# Patient Record
Sex: Female | Born: 1965 | Race: Black or African American | Hispanic: No | Marital: Married | Smoking: Former smoker
Health system: Southern US, Community
[De-identification: ages and names within clinical notes are randomized; demographics above are authoritative.]

---

## 1998-08-19 ENCOUNTER — Other Ambulatory Visit: Admission: RE | Admit: 1998-08-19 | Discharge: 1998-08-19 | Payer: Self-pay | Admitting: Obstetrics and Gynecology

## 1999-09-11 ENCOUNTER — Other Ambulatory Visit: Admission: RE | Admit: 1999-09-11 | Discharge: 1999-09-11 | Payer: Self-pay | Admitting: *Deleted

## 2000-09-13 ENCOUNTER — Other Ambulatory Visit: Admission: RE | Admit: 2000-09-13 | Discharge: 2000-09-13 | Payer: Self-pay | Admitting: Obstetrics and Gynecology

## 2001-10-25 ENCOUNTER — Other Ambulatory Visit: Admission: RE | Admit: 2001-10-25 | Discharge: 2001-10-25 | Payer: Self-pay | Admitting: Obstetrics and Gynecology

## 2002-10-30 ENCOUNTER — Other Ambulatory Visit: Admission: RE | Admit: 2002-10-30 | Discharge: 2002-10-30 | Payer: Self-pay | Admitting: *Deleted

## 2003-07-26 ENCOUNTER — Ambulatory Visit (HOSPITAL_COMMUNITY): Admission: RE | Admit: 2003-07-26 | Discharge: 2003-07-26 | Payer: Self-pay | Admitting: Obstetrics and Gynecology

## 2003-08-23 ENCOUNTER — Ambulatory Visit (HOSPITAL_COMMUNITY): Admission: RE | Admit: 2003-08-23 | Discharge: 2003-08-23 | Payer: Self-pay | Admitting: Obstetrics and Gynecology

## 2003-12-23 ENCOUNTER — Inpatient Hospital Stay (HOSPITAL_COMMUNITY): Admission: AD | Admit: 2003-12-23 | Discharge: 2003-12-25 | Payer: Self-pay | Admitting: Obstetrics and Gynecology

## 2004-05-06 ENCOUNTER — Other Ambulatory Visit: Admission: RE | Admit: 2004-05-06 | Discharge: 2004-05-06 | Payer: Self-pay | Admitting: Obstetrics and Gynecology

## 2005-05-10 ENCOUNTER — Other Ambulatory Visit: Admission: RE | Admit: 2005-05-10 | Discharge: 2005-05-10 | Payer: Self-pay | Admitting: Obstetrics and Gynecology

## 2005-11-23 IMAGING — US US OB DETAIL+14 WK
1 series · 13 of 28 positions shown · non-contrast
Comparison: none

CLINICAL DATA: 37-year-old.  G1 P0 with LMP of 03/17/03.  AMA.  Scan for anatomy.

[Series 1: unknown · 0.30mm/px · 13 of 67 slices shown]
[im 3/67]
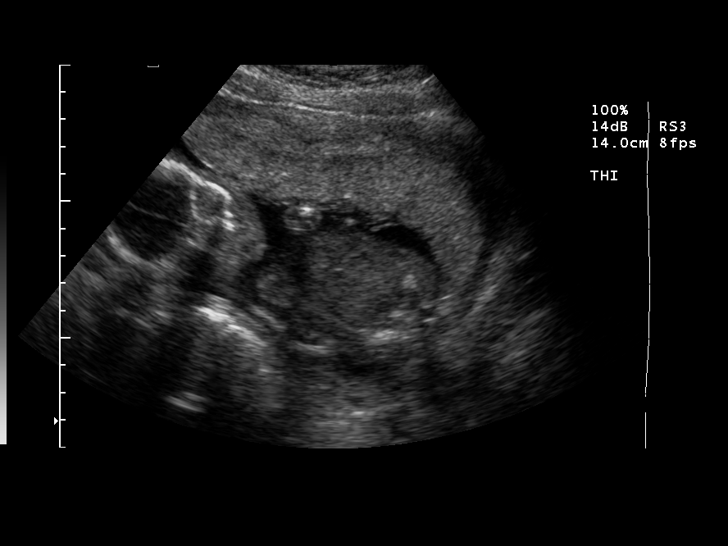
[im 8/67]
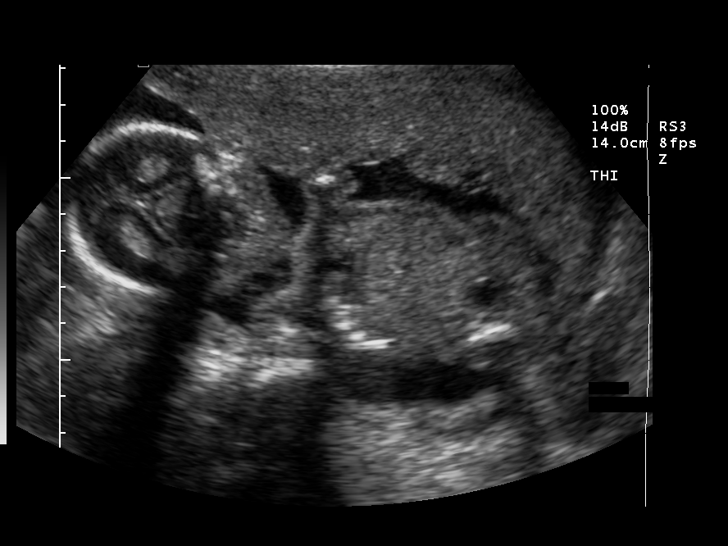
[im 13/67]
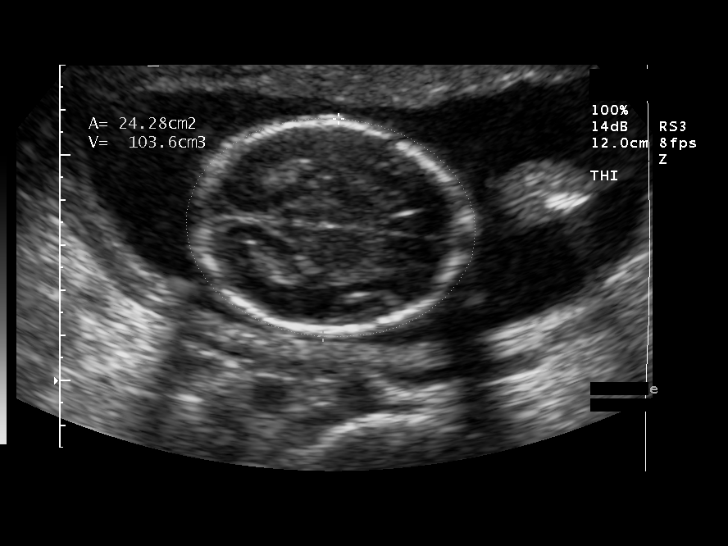
[im 18/67]
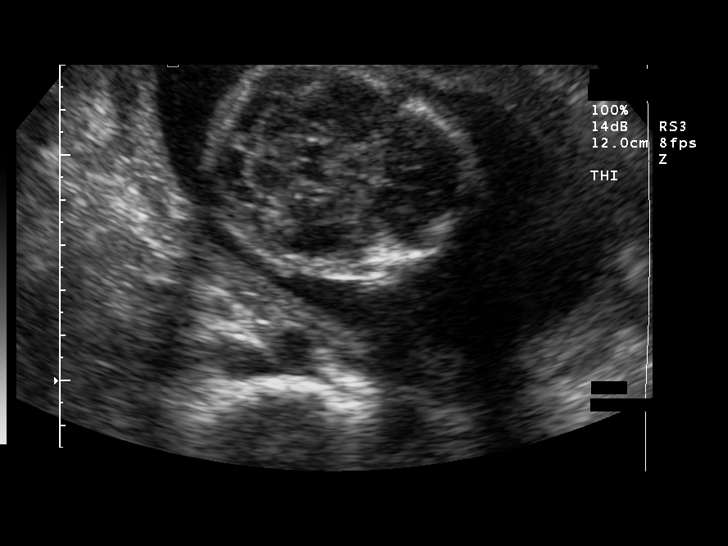
[im 23/67]
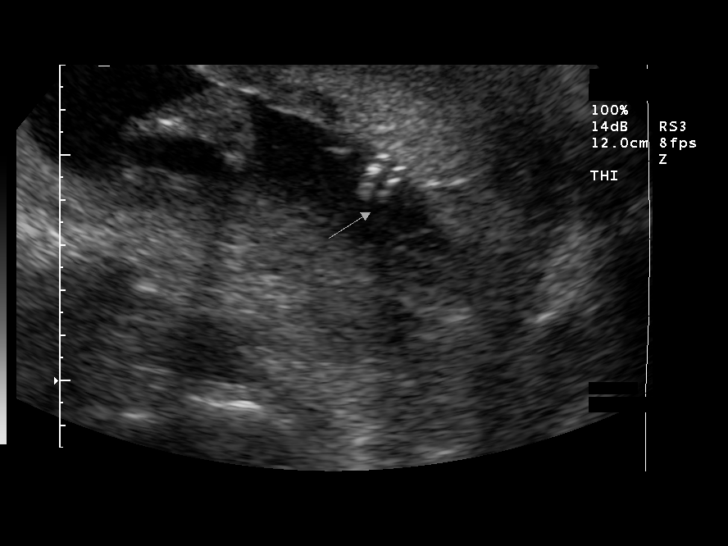
[im 27/67]
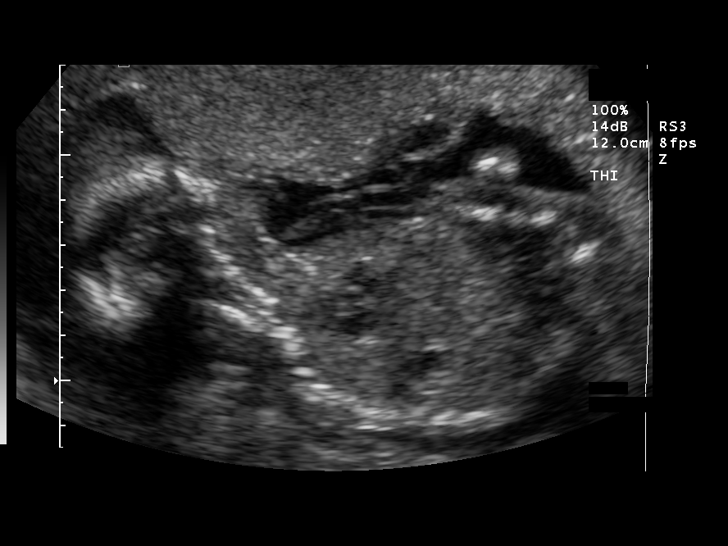
[im 35/67]
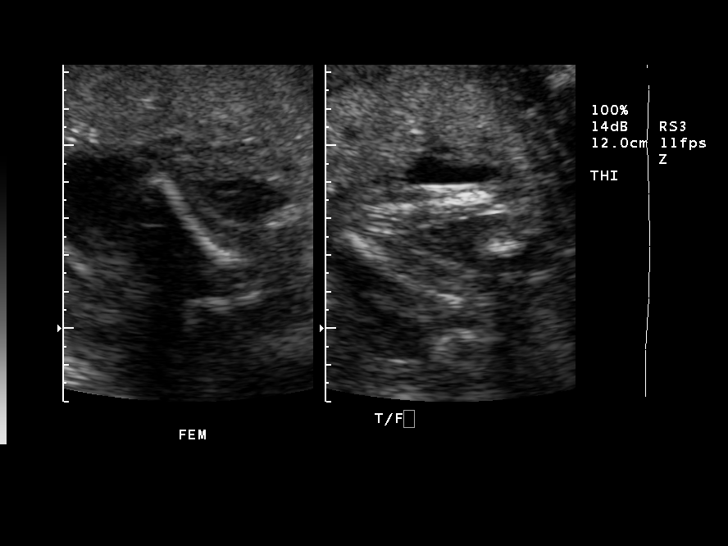
[im 40/67]
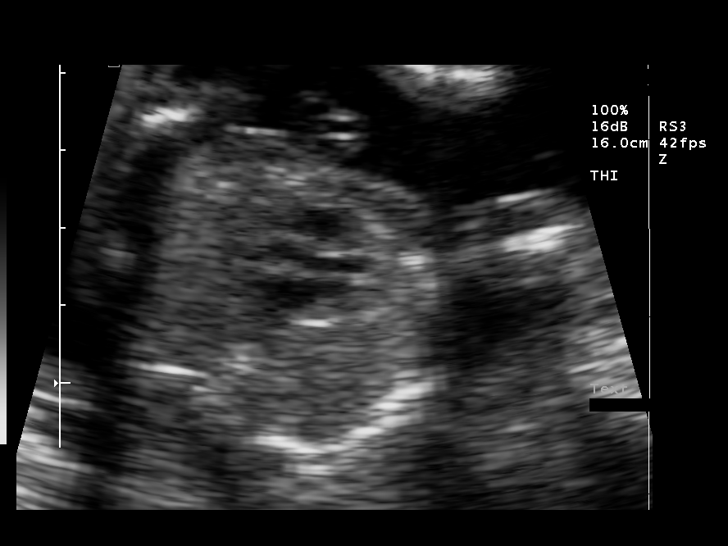
[im 45/67]
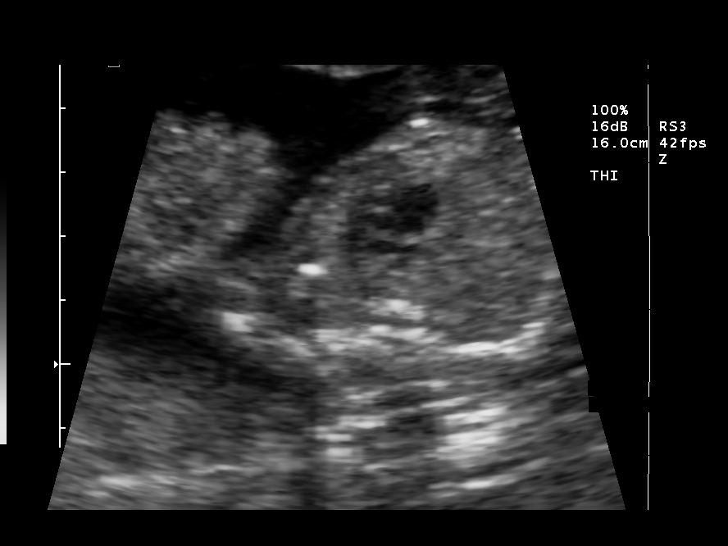
[im 49/67]
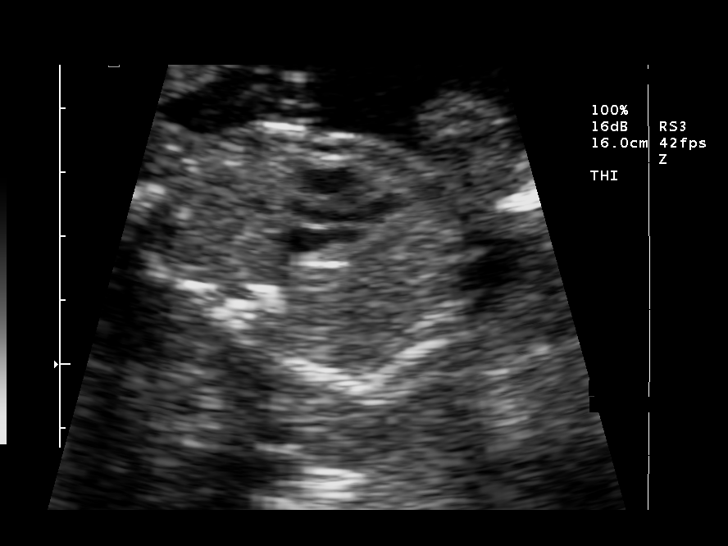
[im 54/67]
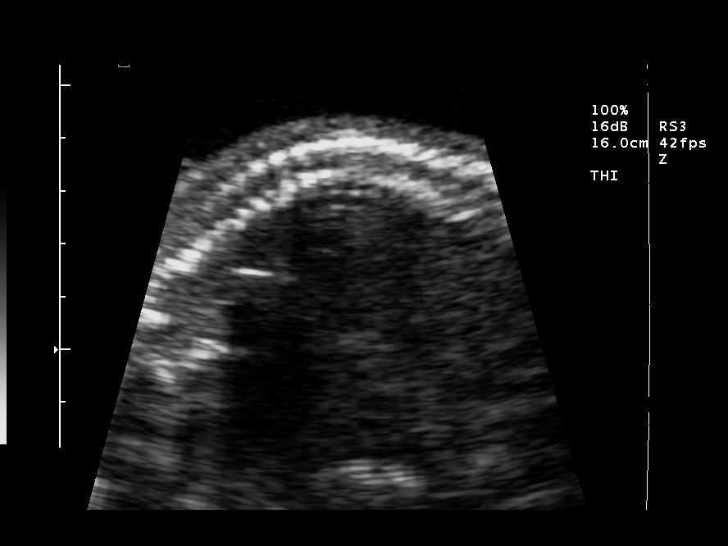
[im 59/67]
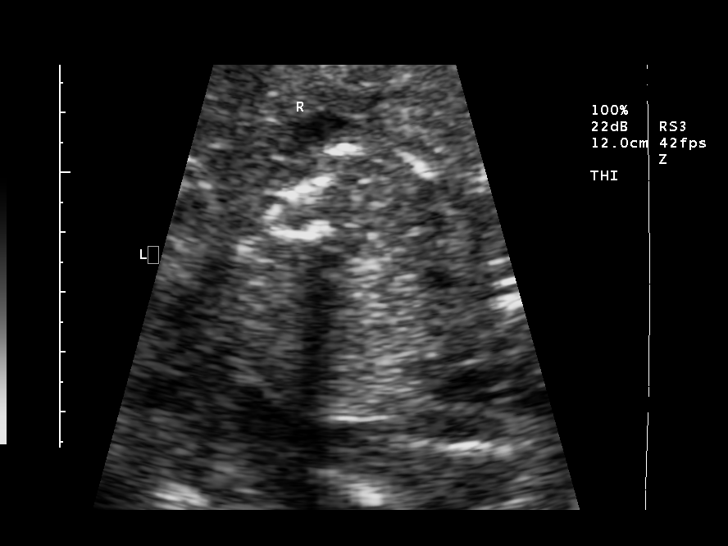
[im 64/67]
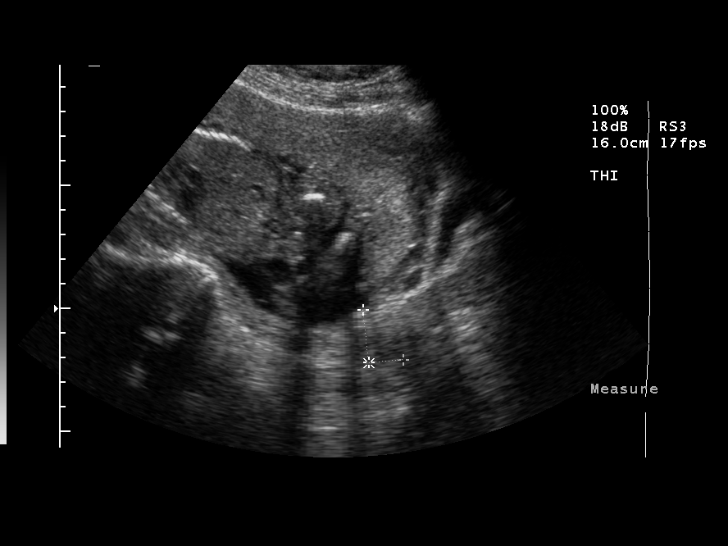

[13 of 28 positions shown; findings below may reference images not displayed]

DETAILED OBSTETRICAL ULTRASOUND 

 Number of Fetuses:  1
 Heart Rate:  140
 Movement:  Yes
 Breathing:  No
 Presentation:  Breech
 Placental Location:  Anterior, low-lying
 Grade:  I
 Amniotic Fluid (Subjective):  Normal
 Amniotic Fluid (Objective):  4.3 cm Vertical pocket 

 FETAL BIOMETRY
 BPD:  4.6 cm  19 w 6 d
 HC:  17.4 cm   20 w 0 d
 AC:  14.7 cm   20 w 0 d
 FL:   3.4 cm   20 w 6 d
 HL:   3.5 cm  21 w 6 d

 MEAN GA:  20 w 4 d

 FETAL ANATOMY
 Lateral Ventricles:  Visualized 
 Thalami/CSP:  Visualized 
 Posterior Fossa:  Visualized 
 Nuchal Region:  N/A
 Spine:  Limited
 4 Chamber Heart on Left:  Visualized 
 Stomach on Left:  Visualized 
 3 Vessel Cord:    Visualized 
 Cord Insertion Site:  Visualized 
 Kidneys:  Visualized 
 Bladder:  Visualized 
 Extremities:  Visualized 

 ADDITIONAL ANATOMY VISUALIZED:  LVOT, RVOT, upper lip, orbits, profile, diaphragm, heel, 5th digit, ductal arch, aortic arch, and female genitalia

 MATERNAL FINDINGS
 Cervix:  3.5 cm Transabdominally
IMPRESSION: Single living intrauterine fetus in breech presentation.  Note is made of a low-lying placenta.  No fetal anomalies are identified.  

 </u12:p>

## 2007-11-03 ENCOUNTER — Ambulatory Visit (HOSPITAL_COMMUNITY): Admission: RE | Admit: 2007-11-03 | Discharge: 2007-11-03 | Payer: Self-pay | Admitting: Obstetrics and Gynecology

## 2007-11-03 ENCOUNTER — Encounter (INDEPENDENT_AMBULATORY_CARE_PROVIDER_SITE_OTHER): Payer: Self-pay | Admitting: Obstetrics and Gynecology

## 2008-04-19 ENCOUNTER — Encounter: Admission: RE | Admit: 2008-04-19 | Discharge: 2008-04-19 | Payer: Self-pay | Admitting: Obstetrics and Gynecology

## 2009-04-21 ENCOUNTER — Encounter: Admission: RE | Admit: 2009-04-21 | Discharge: 2009-04-21 | Payer: Self-pay | Admitting: Obstetrics and Gynecology

## 2009-04-23 ENCOUNTER — Encounter: Admission: RE | Admit: 2009-04-23 | Discharge: 2009-04-23 | Payer: Self-pay | Admitting: Obstetrics and Gynecology

## 2009-08-30 ENCOUNTER — Inpatient Hospital Stay (HOSPITAL_COMMUNITY): Admission: AD | Admit: 2009-08-30 | Discharge: 2009-08-30 | Payer: Self-pay | Admitting: Obstetrics and Gynecology

## 2009-09-01 ENCOUNTER — Inpatient Hospital Stay (HOSPITAL_COMMUNITY): Admission: AD | Admit: 2009-09-01 | Discharge: 2009-09-01 | Payer: Self-pay | Admitting: Pediatrics

## 2010-04-19 ENCOUNTER — Encounter: Payer: Self-pay | Admitting: Obstetrics and Gynecology

## 2010-04-22 ENCOUNTER — Encounter
Admission: RE | Admit: 2010-04-22 | Discharge: 2010-04-22 | Payer: Self-pay | Source: Home / Self Care | Attending: Obstetrics and Gynecology | Admitting: Obstetrics and Gynecology

## 2010-06-15 LAB — CBC
Platelets: 188 10*3/uL (ref 150–400)
RBC: 3.96 MIL/uL (ref 3.87–5.11)
WBC: 7.9 10*3/uL (ref 4.0–10.5)

## 2010-06-15 LAB — HCG, QUANTITATIVE, PREGNANCY: hCG, Beta Chain, Quant, S: 17621 m[IU]/mL — ABNORMAL HIGH (ref <5)

## 2010-06-15 LAB — URINALYSIS, ROUTINE W REFLEX MICROSCOPIC
Bilirubin Urine: NEGATIVE
Ketones, ur: NEGATIVE mg/dL
Specific Gravity, Urine: 1.01 (ref 1.005–1.030)
pH: 7 (ref 5.0–8.0)

## 2010-06-15 LAB — URINE MICROSCOPIC-ADD ON

## 2010-08-11 NOTE — Op Note (Signed)
[name], Meagan Cohen              ACCOUNT NO.:  [id number]   MEDICAL RECORD NO.:  [id number]          PATIENT TYPE:  AMB   LOCATION:  SDC                           FACILITY:  [facility]   PHYSICIAN:  Osborn Coho, M.D.   DATE OF BIRTH:  Aug 12, 1965   DATE OF PROCEDURE:  11/03/2007  DATE OF DISCHARGE:                               OPERATIVE REPORT   PREOPERATIVE DIAGNOSIS:  Missed abortion.   POSTOPERATIVE DIAGNOSIS:  Missed abortion.   PROCEDURE:  Dilatation and evacuation.   ATTENDANT:  Osborn Coho, MD   ANESTHESIA:  MAC.   SPECIMEN TO PATHOLOGY:  POC.   FLUIDS:  700 mL.   URINE OUTPUT:  Quantity sufficient via straight cath prior to procedure.   ESTIMATED BLOOD LOSS:  Minimal.   COMPLICATIONS:  None.   PROCEDURE:  The patient was taken to the operating room after the risks,  benefits, and alternatives discussed with the patient.  The patient  verbalized understanding, consent signed and witnessed.  The patient was  placed under MAC per anesthesia, and prepped and draped in a normal  sterile fashion in the dorsal lithotomy position.  A bivalve speculum  was placed in the patient's vagina and the anterior lip of the cervix  was grasped with a single-tooth tenaculum.  A paracervical block was  administered using a total of 10 mL of 1% lidocaine.  The uterus sounded  to 9 cm and cervix was dilated for passage of the size 7 suction  curette.  Suction curettage was performed until minimal tissue returned.  Sharp curettage was performed until a gritty texture was noted.  Suction  curettage was performed once again to remove any remaining debris.  All  instruments were removed and there was good hemostasis at the tenaculum  site.  Count was correct.  The patient tolerated the procedure well and  is currently awaiting transfer to the recovery room in good condition.      Osborn Coho, M.D.  Electronically Signed     AR/MEDQ  D:  11/03/2007  T:  11/04/2007  Job:  [id number]

## 2010-08-14 NOTE — H&P (Signed)
Meagan Cohen              ACCOUNT NO.:  [id number]   MEDICAL RECORD NO.:  [id number]          PATIENT TYPE:  INP   LOCATION:  9170                          FACILITY:  [facility]   PHYSICIAN:  Janine Limbo, M.D.DATE OF BIRTH:  17-Jul-1965   DATE OF ADMISSION:  12/23/2003  DATE OF DISCHARGE:                                HISTORY & PHYSICAL   HISTORY OF PRESENT ILLNESS:  Ms. Meagan Cohen is a 45 year old female gravida 1  para 0 who presents at 41-and-a-half weeks gestation ([facility] December 12, 2003).  She presents for induction of labor.  She has been followed at the  [facility] division [facility].  The pregnancy has largely been uncomplicated.  The patient  declined amniocentesis.   DRUG ALLERGIES:  PENICILLIN causes a rash.   PAST MEDICAL HISTORY:  The patient had a dislocated elbow and broken thumb,  neither of which are of consequence at this point.  She has had surgery on  both of her feet.  She has had undiagnosed pedal edema.  She had her wisdom  teeth removed at age 69.   SOCIAL HISTORY:  The patient denies cigarette use, alcohol use, and  recreational drug use.   REVIEW OF SYSTEMS:  Normal pregnancy complaints.   FAMILY HISTORY:  Noncontributory.   PHYSICAL EXAMINATION:  VITAL SIGNS:  Weight is 186 pounds.  HEENT:  Within normal limits.  CHEST:  Clear.  HEART:  Regular rate and rhythm.  BREASTS:  Without masses.  ABDOMEN:  Gravid with a fundal height of 38 cm.  EXTREMITIES:  Within normal limits.  NEUROLOGIC:  Grossly normal.  PELVIC:  Cervix today is 2 cm dilated, 75% effaced, and -1 in station.   LABORATORY VALUES:  Blood type is A positive, antibody screen negative.  Sickle cell negative.  VDRL nonreactive.  Rubella immune.  HBsAg negative.  Third trimester beta strep negative.  Third trimester chlamydia negative.  Third trimester gonorrhea negative.   IMPRESSION:  1.  Forty-one-and-a-half weeks  gestation.  2.  Age greater than 35.   PLAN:  The patient is admitted for induction of labor.      AVS/MEDQ  D:  12/23/2003  T:  12/23/2003  Job:  [id number]

## 2010-12-25 LAB — CBC
HCT: 40.3
Hemoglobin: 13.6
MCV: 90.5
Platelets: 211
RBC: 4.45
WBC: 7.3

## 2011-04-01 ENCOUNTER — Other Ambulatory Visit: Payer: Self-pay | Admitting: Obstetrics and Gynecology

## 2011-04-01 DIAGNOSIS — Z1231 Encounter for screening mammogram for malignant neoplasm of breast: Secondary | ICD-10-CM

## 2011-04-27 ENCOUNTER — Ambulatory Visit
Admission: RE | Admit: 2011-04-27 | Discharge: 2011-04-27 | Disposition: A | Discharge status: Home / Self Care | Payer: BC Managed Care – PPO | Source: Ambulatory Visit | Attending: Obstetrics and Gynecology | Admitting: Obstetrics and Gynecology

## 2011-04-27 DIAGNOSIS — Z1231 Encounter for screening mammogram for malignant neoplasm of breast: Secondary | ICD-10-CM

## 2011-06-14 ENCOUNTER — Ambulatory Visit (INDEPENDENT_AMBULATORY_CARE_PROVIDER_SITE_OTHER): Payer: BC Managed Care – PPO | Admitting: Obstetrics and Gynecology

## 2011-06-14 DIAGNOSIS — Z01419 Encounter for gynecological examination (general) (routine) without abnormal findings: Secondary | ICD-10-CM

## 2011-11-30 ENCOUNTER — Ambulatory Visit: Payer: BC Managed Care – PPO

## 2011-11-30 ENCOUNTER — Ambulatory Visit (INDEPENDENT_AMBULATORY_CARE_PROVIDER_SITE_OTHER): Payer: BC Managed Care – PPO | Admitting: Family Medicine

## 2011-11-30 VITALS — BP 112/71 | HR 73 | Temp 99.4°F | Resp 18 | Ht 67.0 in | Wt 177.0 lb

## 2011-11-30 DIAGNOSIS — M25519 Pain in unspecified shoulder: Secondary | ICD-10-CM

## 2011-11-30 DIAGNOSIS — M25512 Pain in left shoulder: Secondary | ICD-10-CM

## 2011-11-30 MED ORDER — HYDROCODONE-ACETAMINOPHEN 5-325 MG PO TABS: 1.0000 | ORAL_TABLET | Freq: Four times a day (QID) | ORAL | Status: AC | PRN

## 2011-11-30 MED ORDER — CYCLOBENZAPRINE HCL 10 MG PO TABS: 10.0000 mg | ORAL_TABLET | Freq: Three times a day (TID) | ORAL | Status: AC | PRN

## 2011-11-30 NOTE — Patient Instructions (Signed)
Your xray looks good. I have sent in a muscle relaxer for you.  Use this at night. Use the Vicodin for pain relief. Heat and massage are also good for your back.    I'm sorry this happened.  It was good to meet you!
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
## 2011-12-13 ENCOUNTER — Ambulatory Visit (INDEPENDENT_AMBULATORY_CARE_PROVIDER_SITE_OTHER): Payer: BC Managed Care – PPO | Admitting: Family Medicine

## 2011-12-13 VITALS — BP 115/76 | HR 68 | Temp 97.8°F | Resp 16 | Ht 66.5 in | Wt 178.0 lb

## 2011-12-13 DIAGNOSIS — M542 Cervicalgia: Secondary | ICD-10-CM

## 2011-12-13 DIAGNOSIS — M7650 Patellar tendinitis, unspecified knee: Secondary | ICD-10-CM

## 2011-12-13 MED ORDER — HYDROCODONE-ACETAMINOPHEN 5-325 MG PO TABS: 1.0000 | ORAL_TABLET | Freq: Four times a day (QID) | ORAL | Status: DC | PRN

## 2011-12-13 NOTE — Patient Instructions (Addendum)
Patellar Tendinitis, Jumper's Knee with Rehab Tendinitis is inflammation of a tendon. Tendonitis of the tendon below the kneecap (patella) is known as patellar tendonitis. Patellar tendonitis is a common cause of pain below the kneecap (infrapatella). Patellar tendonitis may involve a tear (strain) in the ligament. Strains are classified into three categories. Grade 1 strains cause pain, but the tendon is not lengthened. Grade 2 strains include a lengthened ligament, due to the ligament being stretched or partially ruptured. With grade 2 strains there is still function, although function may be decreased. Grade 3 strains involve a complete tear of the tendon or muscle, and function is usually impaired. Patellar tendon strains are usually grade 1 or 2.  SYMPTOMS   Pain, tenderness, swelling, warmth, or redness over the patellar tendon (just below the kneecap).   Pain and loss of strength (sometimes), with forcefully straightening the knee (especially when jumping or rising from a seated or squatting position), or bending the knee completely (squatting or kneeling).   Crackling sound (crepitation) when the tendon is moved or touched.  CAUSES  Patellar tendonitis is caused by injury to the patellar tendon. The inflammation is the body's healing response. Common causes of injury include:  Stress from a sudden increase in intensity, frequency, or duration of training.   Overuse of the quadriceps thigh muscles and patellar tendon.   Direct hit (trauma) to the knee or patellar tendon.  RISK INCREASES WITH:  Sports that require sudden, explosive thigh muscle (quadriceps) contraction, such as jumping, quick starts, or kicking.   Running sports, especially running down hills.   Poor strength and flexibility of the thigh and knee.   Flat feet.  PREVENTION  Warm up and stretch properly before activity.   Allow for adequate recovery between workouts.   Maintain physical fitness:   Strength,  flexibility, and endurance.   Cardiovascular fitness.   Protect the knee joint with taping, protective strapping, bracing, or elastic compression bandage.   Wear arch supports (orthotics).  PROGNOSIS  If treated properly, patellar tendonitis usually heals within 6 weeks.  RELATED COMPLICATIONS   Longer healing time, if not properly treated or if not given enough time to heal.   Recurring symptoms, if activity is resumed too soon, with overuse, with a direct blow, or when using poor technique.   If untreated, tendon rupture requiring surgery.  TREATMENT Treatment first involves the use of ice and medicine, to reduce pain and inflammation. The use of strengthening and stretching exercises may help reduce pain with activity. These exercises may be performed at home or with a therapist. Serious cases of tendonitis may require restraining the knee for 10 to 14 days, to prevent stress on the tendon and to promote healing. Crutches may be used (uncommon) until you can walk without a limp. For cases in which non-surgical treatment is unsuccessful, surgery may be advised, to remove the inflamed tendon lining (sheath). Surgery is rare, and is only advised after at least 6 months of non-surgical treatment. MEDICATION   If pain medicine is needed, nonsteroidal anti-inflammatory medicines (aspirin and ibuprofen), or other minor pain relievers (acetaminophen), are often advised.   Do not take pain medicine for 7 days before surgery.   Prescription pain relievers may be given, if your caregiver thinks they are needed. Use only as directed and only as much as you need.  HEAT AND COLD  Cold treatment (icing) should be applied for 10 to 15 minutes every 2 to 3 hours for inflammation and pain, and immediately  after activity that aggravates your symptoms. Use ice packs or an ice massage.   Heat treatment may be used before performing stretching and strengthening activities prescribed by your caregiver,  physical therapist, or athletic trainer. Use a heat pack or a warm water soak.  SEEK MEDICAL CARE IF:  Symptoms get worse or do not improve in 2 weeks, despite treatment.   New, unexplained symptoms develop. (Drugs used in treatment may produce side effects.)  EXERCISES RANGE OF MOTION (ROM) AND STRETCHING EXERCISES - Patellar Tendinitis (Jumper's Knee) These are some of the initial exercises with which you may start your rehabilitation program, until you see your caregiver again or until your symptoms are resolved. Remember:   Flexible tissue is more tolerant of the stresses placed on it during activities.   Each stretch should be held for 20 to 30 seconds.   A gentle stretching sensation should be felt.  STRETCH - Hamstrings, Supine  Lie on your back. Loop a belt or towel over the ball of your right / left foot.   Straighten your right / left knee and slowly pull on the belt to raise your leg. Do not allow the right / left knee to bend. Keep your opposite leg flat on the floor.   Raise the leg until you feel a gentle stretch behind your right / left knee or thigh. Hold this position for __________ seconds.  Repeat __________ times. Complete this stretch __________ times per day.  STRETCH - Hamstrings, Doorway  Lie on your back with your right / left leg extended and resting on the wall, and the opposite leg flat on the ground through the door. At first, position your bottom farther away from the wall.   Keep your right / left knee straight. If you feel a stretch behind your knee or thigh, hold this position for __________ seconds.   If you do not feel a stretch, scoot your bottom closer to the door, and hold __________ seconds.  Repeat __________ times. Complete this stretch __________ times per day.  STRETCH - Hamstrings, Standing  Stand or sit and extend your right / left leg, placing your foot on a chair or foot stool.   Keep a slight arch in your low back and your hips  straight forward.   Lead with your chest and lean forward at the waist until you feel a gentle stretch in the back of your right / left knee or thigh. (When done correctly, this exercise requires leaning only a small distance.)   Hold this position for __________ seconds.  Repeat __________ times. Complete this stretch __________ times per day. STRETCH - Adductors, Lunge  While standing, spread your legs, with your right / left leg behind you.   Lean away from your right / left leg by bending your opposite knee. You may rest your hands on your thigh for balance.   You should feel a stretch in your right / left inner thigh. Hold for __________ seconds.  Repeat __________ times. Complete this exercise __________ times per day.  STRENGTHENING EXERCISES - Patellar Tendinitis (Jumper's Knee) These exercises may help you when beginning to rehabilitate your injury. They may resolve your symptoms with or without further involvement from your physician, physical therapist or athletic trainer. While completing these exercises, remember:   Muscles can gain both the endurance and the strength needed for everyday activities through controlled exercises.   Complete these exercises as instructed by your physician, physical therapist or athletic trainer. Increase the resistance  and repetitions only as guided by your caregiver.  STRENGTH - Quadriceps, Isometrics  Lie on your back with your right / left leg extended and your opposite knee bent.   Gradually tense the muscles in the front of your right / left thigh. You should see either your kneecap slide up toward your hip or increased dimpling just above the knee. This motion will push the back of the knee down toward the floor, mat, or bed on which you are lying.   Hold the muscle as tight as you can, without increasing your pain, for __________ seconds.   Relax the muscles slowly and completely in between each repetition.  Repeat __________ times.  Complete this exercise __________ times per day.  STRENGTH - Quadriceps, Short Arcs  Lie on your back. Place a __________ inch towel roll under your right / left knee, so that the knee bends slightly.   Raise only your lower leg by tightening the muscles in the front of your thigh. Do not allow your thigh to rise.   Hold this position for __________ seconds.  Repeat __________ times. Complete this exercise __________ times per day.  OPTIONAL ANKLE WEIGHTS: Begin with ____________________, but DO NOT exceed ____________________. Increase in 1 pound/ 0.5 kilogram increments. STRENGTH - Quadriceps, Straight Leg Raises  Quality counts! Watch for signs that the quadriceps muscle is working, to be sure you are strengthening the correct muscles and not "cheating" by substituting with healthier muscles.  Lay on your back with your right / left leg extended and your opposite knee bent.   Tense the muscles in the front of your right / left thigh. You should see either your kneecap slide up or increased dimpling just above the knee. Your thigh may even shake a bit.   Tighten these muscles even more and raise your leg 4 to 6 inches off the floor. Hold for __________ seconds.   Keeping these muscles tense, lower your leg.   Relax the muscles slowly and completely between each repetition.  Repeat __________ times. Complete this exercise __________ times per day.  STRENGTH - Quadriceps, Squats  Stand in a door frame so that your feet and knees are in line with the frame.   Use your hands for balance, not support, on the frame.   Slowly lower your weight, bending at the hips and knees. Keep your lower legs upright so that they are parallel with the door frame. Squat only within the range that does not increase your knee pain. Never let your hips drop below your knees.   Slowly return upright, pushing with your legs, not pulling with your hands.  Repeat __________ times. Complete this exercise  __________ times per day.  STRENGTH - Quadriceps, Step-Downs  Stand on the edge of a step stool or stair. Be prepared to use a countertop or wall for balance, if needed.   Keeping your right / left knee directly over the middle of your foot, slowly touch your opposite heel to the floor or lower step. Do not go all the way to the floor if your knee pain increases, just go as far as you can without increased discomfort. Use your right / left leg muscles, not gravity to lower your body weight.   Slowly push your body weight back up to the starting position,  Repeat __________ times. Complete this exercise __________ times per day.  Document Released: 03/15/2005 Document Revised: 03/04/2011 Document Reviewed: 06/27/2008 [facility] Patient Information 2012 [place], [place].
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
## 2011-12-13 NOTE — Progress Notes (Signed)
Reviewed and agree.

## 2012-03-31 ENCOUNTER — Other Ambulatory Visit: Payer: Self-pay | Admitting: Obstetrics and Gynecology

## 2012-03-31 DIAGNOSIS — Z1231 Encounter for screening mammogram for malignant neoplasm of breast: Secondary | ICD-10-CM

## 2012-04-27 ENCOUNTER — Ambulatory Visit
Admission: RE | Admit: 2012-04-27 | Discharge: 2012-04-27 | Disposition: A | Discharge status: Home / Self Care | Payer: BC Managed Care – PPO | Source: Ambulatory Visit | Attending: Obstetrics and Gynecology | Admitting: Obstetrics and Gynecology

## 2012-04-27 DIAGNOSIS — Z1231 Encounter for screening mammogram for malignant neoplasm of breast: Secondary | ICD-10-CM

## 2013-03-30 ENCOUNTER — Other Ambulatory Visit: Payer: Self-pay

## 2013-03-30 DIAGNOSIS — Z1231 Encounter for screening mammogram for malignant neoplasm of breast: Secondary | ICD-10-CM

## 2013-05-01 ENCOUNTER — Ambulatory Visit
Admission: RE | Admit: 2013-05-01 | Discharge: 2013-05-01 | Disposition: A | Discharge status: Home / Self Care | Payer: BC Managed Care – PPO | Source: Ambulatory Visit

## 2013-05-01 DIAGNOSIS — Z1231 Encounter for screening mammogram for malignant neoplasm of breast: Secondary | ICD-10-CM

## 2013-12-12 ENCOUNTER — Ambulatory Visit (INDEPENDENT_AMBULATORY_CARE_PROVIDER_SITE_OTHER): Payer: BC Managed Care – PPO | Admitting: Family Medicine

## 2013-12-12 ENCOUNTER — Ambulatory Visit (INDEPENDENT_AMBULATORY_CARE_PROVIDER_SITE_OTHER): Payer: BC Managed Care – PPO

## 2013-12-12 VITALS — BP 122/78 | HR 86 | Temp 98.4°F | Resp 16 | Ht 66.5 in | Wt 186.4 lb

## 2013-12-12 DIAGNOSIS — M25519 Pain in unspecified shoulder: Secondary | ICD-10-CM

## 2013-12-12 DIAGNOSIS — M25512 Pain in left shoulder: Secondary | ICD-10-CM

## 2013-12-12 LAB — POCT CBC
Granulocyte percent: 61.7 %G (ref 37–80)
HCT, POC: 39.8 % (ref 37.7–47.9)
Hemoglobin: 13.2 g/dL (ref 12.2–16.2)
Lymph, poc: 1.6 (ref 0.6–3.4)
MCH, POC: 30.2 pg (ref 27–31.2)
MCHC: 33.3 g/dL (ref 31.8–35.4)
MCV: 90.6 fL (ref 80–97)
MID (cbc): 0.3 (ref 0–0.9)
MPV: 9.6 fL (ref 0–99.8)
POC Granulocyte: 3.2 (ref 2–6.9)
POC LYMPH PERCENT: 31.6 %L (ref 10–50)
POC MID %: 6.7 %M (ref 0–12)
Platelet Count, POC: 155 10*3/uL (ref 142–424)
RBC: 4.39 M/uL (ref 4.04–5.48)
RDW, POC: 13.5 %
WBC: 5.2 10*3/uL (ref 4.6–10.2)

## 2013-12-12 LAB — POCT SEDIMENTATION RATE: POCT SED RATE: 29 mm/hr — AB (ref 0–22)

## 2013-12-12 NOTE — Progress Notes (Signed)
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 She started noticing some left supraclavicular swelling and discomfort yesterday has gotten worse today. Her coworker, physician assistant, suggested she get this checked out.  Patient's had no recent cough or fever. She has no pain which moves her left shoulder. She has no history of trauma to that area.  Objective: No acute distress Slight soft tissue swelling in the midclavicular area both above and below the clavicle. She has full range of motion left shoulder. Full range of motion of her neck. No adenopathy of the neck. No definite adenopathy in the supraclavicular area. Lungs are clear Heart: Regular no murmur Skin: No rash Results for orders placed in visit on 12/12/13  POCT CBC      Result Value Ref Range   WBC 5.2  4.6 - 10.2 K/uL   Lymph, poc 1.6  0.6 - 3.4   POC LYMPH PERCENT 31.6  10 - 50 %L   MID (cbc) 0.3  0 - 0.9   POC MID % 6.7  0 - 12 %M   POC Granulocyte 3.2  2 - 6.9   Granulocyte percent 61.7  37 - 80 %G   RBC 4.39  4.04 - 5.48 M/uL   Hemoglobin 13.2  12.2 - 16.2 g/dL   HCT, POC 16.1  09.6 - 47.9 %   MCV 90.6  80 - 97 fL   MCH, POC 30.2  27 - 31.2 pg   MCHC 33.3  31.8 - 35.4 g/dL   RDW, POC 04.5     Platelet Count, POC 155  142 - 424 K/uL   MPV 9.6  0 - 99.8 fL    [facility] reading (PRIMARY) by  Dr. Milus Glazier: negative CXR.  Assessment: Patient may have some mild adenopathy but there is certainly nothing worrisome going on here with respect to her lab tests or x-ray. Physical exam is not particularly striking either.  Plan: Reassure patient for now. I've asked her to come back if she has new symptoms or the swelling increases  Signed, Sheila Oats.D.

## 2014-04-10 ENCOUNTER — Other Ambulatory Visit: Payer: Self-pay

## 2014-04-10 DIAGNOSIS — Z1231 Encounter for screening mammogram for malignant neoplasm of breast: Secondary | ICD-10-CM

## 2014-05-02 ENCOUNTER — Encounter (INDEPENDENT_AMBULATORY_CARE_PROVIDER_SITE_OTHER): Payer: Self-pay

## 2014-05-02 ENCOUNTER — Ambulatory Visit
Admission: RE | Admit: 2014-05-02 | Discharge: 2014-05-02 | Disposition: A | Discharge status: Home / Self Care | Payer: BC Managed Care – PPO | Source: Ambulatory Visit

## 2014-05-02 DIAGNOSIS — Z1231 Encounter for screening mammogram for malignant neoplasm of breast: Secondary | ICD-10-CM

## 2015-05-21 ENCOUNTER — Other Ambulatory Visit: Payer: Self-pay

## 2015-05-21 DIAGNOSIS — Z1231 Encounter for screening mammogram for malignant neoplasm of breast: Secondary | ICD-10-CM

## 2015-06-02 ENCOUNTER — Ambulatory Visit
Admission: RE | Admit: 2015-06-02 | Discharge: 2015-06-02 | Disposition: A | Discharge status: Home / Self Care | Payer: BC Managed Care – PPO | Source: Ambulatory Visit

## 2015-06-02 DIAGNOSIS — Z1231 Encounter for screening mammogram for malignant neoplasm of breast: Secondary | ICD-10-CM

## 2016-05-06 ENCOUNTER — Other Ambulatory Visit: Payer: Self-pay | Admitting: Obstetrics and Gynecology

## 2016-05-06 DIAGNOSIS — Z1231 Encounter for screening mammogram for malignant neoplasm of breast: Secondary | ICD-10-CM

## 2016-06-02 ENCOUNTER — Ambulatory Visit
Admission: RE | Admit: 2016-06-02 | Discharge: 2016-06-02 | Disposition: A | Discharge status: Home / Self Care | Payer: BC Managed Care – PPO | Source: Ambulatory Visit | Attending: Obstetrics and Gynecology | Admitting: Obstetrics and Gynecology

## 2016-06-02 DIAGNOSIS — Z1231 Encounter for screening mammogram for malignant neoplasm of breast: Secondary | ICD-10-CM

## 2017-03-15 ENCOUNTER — Ambulatory Visit: Payer: BC Managed Care – PPO | Admitting: Emergency Medicine

## 2017-03-16 ENCOUNTER — Ambulatory Visit: Payer: BC Managed Care – PPO | Admitting: Urgent Care

## 2017-05-30 ENCOUNTER — Other Ambulatory Visit: Payer: Self-pay | Admitting: Obstetrics and Gynecology

## 2017-05-30 DIAGNOSIS — Z1231 Encounter for screening mammogram for malignant neoplasm of breast: Secondary | ICD-10-CM

## 2017-06-17 ENCOUNTER — Ambulatory Visit
Admission: RE | Admit: 2017-06-17 | Discharge: 2017-06-17 | Disposition: A | Discharge status: Home / Self Care | Payer: BC Managed Care – PPO | Source: Ambulatory Visit | Attending: Obstetrics and Gynecology | Admitting: Obstetrics and Gynecology

## 2017-06-17 DIAGNOSIS — Z1231 Encounter for screening mammogram for malignant neoplasm of breast: Secondary | ICD-10-CM

## 2017-11-08 ENCOUNTER — Encounter: Payer: Self-pay | Admitting: Podiatry

## 2017-11-08 ENCOUNTER — Ambulatory Visit (INDEPENDENT_AMBULATORY_CARE_PROVIDER_SITE_OTHER): Payer: BC Managed Care – PPO

## 2017-11-08 ENCOUNTER — Ambulatory Visit: Payer: BC Managed Care – PPO | Admitting: Podiatry

## 2017-11-08 VITALS — BP 134/81 | HR 86 | Resp 16

## 2017-11-08 DIAGNOSIS — M778 Other enthesopathies, not elsewhere classified: Secondary | ICD-10-CM

## 2017-11-08 DIAGNOSIS — M84375A Stress fracture, left foot, initial encounter for fracture: Secondary | ICD-10-CM

## 2017-11-08 DIAGNOSIS — A6000 Herpesviral infection of urogenital system, unspecified: Secondary | ICD-10-CM | POA: Insufficient documentation

## 2017-11-08 DIAGNOSIS — M779 Enthesopathy, unspecified: Secondary | ICD-10-CM | POA: Diagnosis not present

## 2017-11-08 DIAGNOSIS — I890 Lymphedema, not elsewhere classified: Secondary | ICD-10-CM | POA: Diagnosis not present

## 2017-11-08 DIAGNOSIS — N63 Unspecified lump in unspecified breast: Secondary | ICD-10-CM | POA: Insufficient documentation

## 2017-11-09 ENCOUNTER — Other Ambulatory Visit: Payer: Self-pay

## 2017-11-09 DIAGNOSIS — I890 Lymphedema, not elsewhere classified: Secondary | ICD-10-CM

## 2017-11-09 NOTE — Progress Notes (Signed)
  Subjective:  Patient ID: Meagan Cohen, female    DOB: 12/09/1965,  MRN: [id number] HPI Chief Complaint  Patient presents with  . Foot Pain    Patient states she has had swelling in feet, ankles and legs since 1980's - she has went for circulation testing in elementary school, changed her diet, exercise, compression, ice, elevation - sometimes has pain but not all the time  . New Patient (Initial Visit)    52 y.o. female presents with the above complaint.   ROS: Denies fever chills nausea vomiting muscle aches pains calf pain back pain chest pain shortness of breath.  No past medical history on file. No past surgical history on file.  Current Outpatient Medications:  .  levonorgestrel (MIRENA, 52 MG,) 20 MCG/24HR IUD, Mirena 20 mcg/24 hours (5 yrs) 52 mg intrauterine device  Take 1 device by intrauterine route as directed., Disp: , Rfl:   Allergies  Allergen Reactions  . Penicillins    Review of Systems Objective:   Vitals:   11/08/17 1422  BP: 134/81  Pulse: 86  Resp: 16    General: Well developed, nourished, in no acute distress, alert and oriented x3   Dermatological: Skin is warm, dry and supple bilateral. Nails x 10 are well maintained; remaining integument appears unremarkable at this time. There are no open sores, no preulcerative lesions, no rash or signs of infection present.  Vascular: Dorsalis Pedis artery and Posterior Tibial artery pedal pulses are 2/4 bilateral with immedate capillary fill time. Pedal hair growth present. No varicosities and no lower extremity edema present bilateral.   Neruologic: Grossly intact via light touch bilateral. Vibratory intact via tuning fork bilateral. Protective threshold with Semmes Wienstein monofilament intact to all pedal sites bilateral. Patellar and Achilles deep tendon reflexes 2+ bilateral. No Babinski or clonus noted bilateral.   Musculoskeletal: No gross boney pedal deformities bilateral. No pain, crepitus, or  limitation noted with foot and ankle range of motion bilateral. Muscular strength 5/5 in all groups tested bilateral.  Pain on palpation third metatarsal area mid diaphyseal region right foot.  Gait: Unassisted, Nonantalgic.    Radiographs:  Radiographs taken today demonstrate mild pes planus more significantly long-standing edema to the bilateral lower extremities.  Do not see calcification within the arteries there are couple of small phleboliths.  There appears to be a possible area of inflammation around the midshaft of the third metatarsal of the right foot.  I see no discrete fasciitis but correlating with pain this could very well demonstrate an early stress fracture.  Assessment & Plan:   Assessment: Chronic genetic lymphedema bilateral.  Possible stress fracture third diaphyseal region right foot  Plan: Placed her in a Darco shoe and since her to [facility].     Max T. IXLHyatt, North DakotaDPM

## 2017-11-30 ENCOUNTER — Other Ambulatory Visit: Payer: Self-pay

## 2017-11-30 ENCOUNTER — Ambulatory Visit: Payer: BC Managed Care – PPO | Attending: Podiatry | Admitting: Occupational Therapy

## 2017-11-30 ENCOUNTER — Encounter: Payer: Self-pay | Admitting: Occupational Therapy

## 2017-11-30 DIAGNOSIS — Q820 Hereditary lymphedema: Secondary | ICD-10-CM | POA: Insufficient documentation

## 2017-11-30 NOTE — Patient Instructions (Signed)

## 2017-12-01 NOTE — Therapy (Signed)
[facility] MAIN [facility] SERVICES [address] [place], [place], [address] Phone: [phone]   Fax:  [phone]  Occupational Therapy Evaluation  Patient Details  Name: Meagan Cohen MRN: [id number] Date of Birth: 05/25/1965 Referring Provider: Elinor Parkinson, DPM   Encounter Date: 11/30/2017  OT End of Session - 11/30/17 1128    Visit Number  1    Number of Visits  36    Date for OT Re-Evaluation  02/28/18    OT Start Time  0900    OT Stop Time  1000    OT Time Calculation (min)  60 min    Activity Tolerance  Patient tolerated treatment well    Behavior During Therapy  [facility] for tasks assessed/performed       History reviewed. No pertinent past medical history.  History reviewed. No pertinent surgical history.  There were no vitals filed for this visit.  Subjective Assessment - 12/01/17 1357    Subjective   Meagan Cohen is referred to Occupational Therapy by Elinor Parkinson, DPM, for evaluation and treatment of BLE lymphedema. Meagan Cohen reports onset at age 52. She reports notable worsening during menstruation.  Meagan Cohen underwent lymphscintigrphy at [facility]. Results are unclear by report. She reports exacerbations during pregnancy in her 20's and again after knee surgery in 2016. Meagan Cohen denies known family history of leg swelling. Meagan Cohen reports lymphedema worsens throughout the day and with walking, standing and extended dependent positioning w/ sitting. She has not undergone complete decongestive therapy (CDT) for lymphedema care previously. She had off-the-shelf, knee length compression garments "years ago", but abandoned them as "they were too hot and uncomfortable".    Pertinent History  BLE leg swelling onset at age 52 is in keeping with onset pattern for primary lymphedema praecox. No past medical hx on file    Limitations  BLE Leg swelling and  associated pain/ discomfort limits LB  dressing and fitting preferred street shoes. They negatively impact  body image and participation in activities where legs are exposed. Leg swelling and pain limit participation in productive, leisure and social activities requiring standing, walking dependent sitting, including driving, for more than 15 mintes.     Patient Stated Goals  decrease leg swelling and kleep it from getting worse\    Currently in Pain?  Yes    Pain Score  5     Pain Location  Leg    Pain Orientation  Right;Left    Pain Descriptors / Indicators  Discomfort;Pressure;Heaviness;Other (Comment)   fullness   Pain Type  Chronic pain    Pain Onset  Other (comment)   age 52   Pain Frequency  Intermittent    Aggravating Factors   standing, walking, sitting with klegs dependent, and driving > 15 minutes, hot summer weather    Pain Relieving Factors  elevation    Effect of Pain on Daily Activities  see LIMITATIONS    Multiple Pain Sites  No         [facility] OT Assessment - 12/01/17 0001      Assessment   Medical Diagnosis  mild, stage II, BLE primary LE Praecox , R>L    Referring Provider  Max Maud Deed, DPM    Onset Date/Surgical Date  --   age 52   Hand Dominance  Right    Prior Therapy  none      Precautions   Precautions  Other (comment)   LE skin precautions to limit cellulitis risk  Restrictions   Weight Bearing Restrictions  No      Balance Screen   Has the patient fallen in the past 6 months  No    Is the patient reluctant to leave their home because of a fear of falling?   No      Prior Function   Level of Independence  Independent;Independent with basic ADLs;Independent with household mobility without device;Independent with community mobility without device;Independent with homemaking with ambulation;Independent with gait;Independent with transfers    Vocation  Full time employment    Vocation Requirements  standing, waking, sitting , driving    Leisure  family      IADL   Prior Level of Function Shopping  I    Shopping  --   limiited by leg swelling and  associated pain/discomfort   Prior Level of Function Light Housekeeping  I    Light Housekeeping  --   limiited by leg swelling and associated pain/discomfort   Prior Level of Function Meal Prep  I    Meal Prep  --   sits for this activity if > 15 mintes required   Prior Level of Function Community Mobility  I    Community Mobility  --   limiited by leg swelling and associated pain/discomfort     Mobility   Mobility Status  Independent      ROM / Strength   AROM / PROM / Strength  --   WNL      Mild, Stage II , Primary BLE lymphedema Praecox Skin Description Hyper-Keratosis Peau' de Orange Shiny Tight Fibrotic Fatty Doughy Indurated     x x x  x x      Hydration Dry Flaky Erythema Other         Color Redness Present Pallor Blanching Hemosiderin Staining Other          Odor Malodorous Yeast  Absent      x   Temperature Warm Cool wnl     X   Pitting Edema   1+ 2+ 3+ 4+ Non-pitting        x   Girth Symmetrical Asymmetrical Other Distribution    R>l > congestion below knees   Stemmer Sign Positive Negative     STRONG +     Lymphorrea History Of:  Present Absent   x      Wounds History Of Present Absent Venous Arterial Pressure Size   denies             Signs of Infection Redness Warmth Erythema Acute Swelling Drainage Borders                   Scars Adhesions Hypersensitivity        Sensation Light Touch Deep pressure Hypersensitivty   Present Impaired Present Impaired Absent Impaired   x   x  x   x  Nails WNL Fungus Present Other   x     Hair Growth Symmetrical Asymmetrical   unremarkable    Skin Creases Base of toes Ankle Base of Finger Medial Thigh         x x                OT Treatments/Exercises (OP) - 12/01/17 0001      ADLs   LB Dressing  limited by leg swelling and pain    Functional Mobility  limited by leg swelling and pain    Cooking  limited by leg swelling and pain  Home Maintenance  limited by leg swelling  and pain    Driving  limited by leg swelling and pain    Work  limited by leg swelling and pain    Leisure  limited by leg swelling and pain    ADL Education Given  Yes      Manual Therapy   Manual Therapy  Edema management             OT Education - 12/01/17 1423    Education Details  Provided Meagan Cohen and family skilled education regarding lymphatic structure and function, lymphedema etiologies, onset patterns and stages of progression. Discussed  impact of obesity on lymphatic system function. Outlined Complete Decongestive Therapy (CDT)  as standard of LE care and provided in depth information regarding 4 primary components of both Intensive and Self Management Phases, including Manual Lymph Drainage (MLD), compression wrapping and garments, skin care, and therapeutic exercise.   Discussed   Importance of daily, ongoing LE self-care essential to retaining clinical gains and limiting progression.  Lastly, reviewed lymphedema precautions, including cellulitis risk and difficulty with wound healing. Provided printed Lymphedema Workbook for reference.    Person(s) Educated  Patient    Methods  Explanation;Demonstration;Handout    Comprehension  Verbalized understanding          OT Long Term Goals - 12/01/17 1423      OT LONG TERM GOAL #1   Title  Meagan Cohen will be able to apply gradient /compression wraps using correct techniques withing 5 OT visits to increase independence w/ LE self care and to increase limb volume reduction.    Baseline  Max A    Time  5    Period  Weeks    Status  New    Target Date  --   5th OT visit     OT LONG TERM GOAL #2   Title  Meagan Cohen to achieve 10 % limb volume reductions bilaterally to restore more normal limb shape, to limit lymphedema progression, and to reduce infection risk.    Baseline  Max A    Time  12    Period  Weeks    Status  New    Target Date  03/01/18      OT LONG TERM GOAL #3   Title  Meagan Cohen independent with simple self-MLD by DC date to ensure  optimal LE control and self management to limit progression and further functional decline.    Baseline  Max A    Time  12    Period  Weeks    Status  New    Target Date  03/01/18      OT LONG TERM GOAL #4   Title  Meagan Cohen 85% or greater compliant with all LE self care/ home program components by DC, including daily simple self-MLD, skin care, compression therapy and ther ex by DC date to ensure optimal lymphedema self management over time.    Baseline  Max A    Time  12    Target Date  03/01/18      OT LONG TERM GOAL #5   Title  Management Phase: Meagan Cohen retain volumetric reduction over time by performing LE self care components daily as evidenced by no greater than 3% limb volume increases measured bilaterally below  the knee.    Baseline  Max A    Time  6    Period  Months    Status  New    Target Date  05/30/18  Plan - 12/01/17 1434    Clinical Impression Statement  Meagan Cohen is a 52 year-old female presenting with mild, stage II, BLE, primary lymphedema (LE) praecox, a chronic, progressive, incurable condition characterized by swelling of the soft tissues in which an excessive amount of protein-rich lymph and tissue fibrosis has accumulated. LE praecox typically develops in females between the ages of 94 and 85. This is the most common form of primary LE.  Leg swelling and associated pain/ discomfort limits Meagan Cohen's ability to fit lower body clothing and shoes, to perform home management activities, leisure pursuits and productive activities / work. They limit social participation and have a negative impact on body image   Ongoing lymphatic congestion coupled with worsening tissue fibrosis is known to limit  tissue flexibility, mobility/ AROM, and increases infection risk and limits wound healing. Meagan Cohen will benefit from Occupational Therapy for both Intensive and Management Phase Complete Decongestive Therapy (CDT), to include manual lymphatic drainage, skin care,  therapeutic exercise and compression therapy. Emphasis throughout OT course will also focus on Meagan Cohen education for long term LE self-care. Without skilled OT for CDT LE will progress and further functional decline is expected.    Occupational performance deficits (Please refer to evaluation for details):  ADL's;IADL's;Work;Leisure;Social Participation;Other   body image   Rehab Potential  Excellent    Current Impairments/barriers affecting progress:  long term nature of limb swelling with stubborn tissue fibrosis is typically didfficult to reduce       quickly    OT Frequency  Other (comment)   3-5 x weekly   OT Duration  12 weeks   and PRN   OT Treatment/Interventions  Self-care/ADL training;Therapeutic exercise;Manual lymph drainage;Coping strategies training;Compression bandaging;Patient/family education;Other (comment);Therapeutic activities;DME and/or AE instruction;Manual Therapy   skin care to 4reduce tissue fibrosis   Clinical Decision Making  Limited treatment options, no task modification necessary    OT Home Exercise Plan  lymphatic pumping therex- BLE    Recommended Other Services  fit with BLE custom, flat knit compression garments that will decrease tissue fibrosis and contain swelling.  Consider Tactile Medical Flexitouch system to assist w/ long-term LE self -management over time to limity progression. This 32-chamber device is the only sequential device which mimics proximal to distal lymphatic decongestion to return interstitial fluid through lymphatic structures to the heart.     Consulted and Agree with Plan of Care  Patient       Patient will benefit from skilled therapeutic intervention in order to improve the following deficits and impairments:  Decreased mobility, Decreased skin integrity, Decreased knowledge of precautions, Increased edema, Pain, Decreased activity tolerance, Decreased knowledge of use of DME  Visit Diagnosis: Lymphedema, hereditary    Problem  List Patient Active Problem List   Diagnosis Date Noted  . Breast lump 11/08/2017  . Genital herpes simplex 11/08/2017    Meagan Cohen 12/01/2017, 3:08 PM  [facility] MAIN [facility] SERVICES [address] [place], [place], [address] Phone: [phone]   Fax:  [phone]  Name: ANTWAN BRIBIESCA MRN: [id number] Date of Birth: 03-Jan-1966

## 2017-12-19 ENCOUNTER — Ambulatory Visit: Payer: BC Managed Care – PPO | Admitting: Occupational Therapy

## 2017-12-19 DIAGNOSIS — Q820 Hereditary lymphedema: Secondary | ICD-10-CM

## 2017-12-19 NOTE — Therapy (Signed)
[facility] MAIN [facility] SERVICES [address] [place], [place], [address] Phone: [phone]   Fax:  [phone]  Occupational Therapy Treatment  Patient Details  Name: Meagan Cohen MRN: [id number] Date of Birth: 05-Jul-1965 Referring Provider: Elinor Parkinson, DPM   Encounter Date: 12/19/2017  OT End of Session - 12/19/17 1523    Visit Number  2    Number of Visits  36    Date for OT Re-Evaluation  02/28/18    Authorization Type  Baseline volumetrics 12/19/17    OT Start Time  0154    OT Stop Time  0300    OT Time Calculation (min)  66 min    Activity Tolerance  Patient tolerated treatment well    Behavior During Therapy  [facility] for tasks assessed/performed       No past medical history on file.  No past surgical history on file.  There were no vitals filed for this visit.  Subjective Assessment - 12/19/17 1507    Subjective   Meagan Cohen presents for Occupational Therapy visit 3/ 36 to address BLE primary lymphedema praecox R>L. Pt has no new complaints since initial evaluation.    Pertinent History  BLE leg swelling onset at age 52 is in keeping with onset pattern for primary lymphedema praecox. No past medical hx on file    Limitations  BLE Leg swelling and  associated pain/ discomfort limits LB  dressing and fitting preferred street shoes. They negatively impact body image and participation in activities where legs are exposed. Leg swelling and pain limit participation in productive, leisure and social activities requiring standing, walking dependent sitting, including driving, for more than 15 mintes.     Patient Stated Goals  decrease leg swelling and kleep it from getting worse\    Currently in Pain?  No/denies    Pain Score  0-No pain    Pain Onset  Other (comment)   age 52         LYMPHEDEMA/ONCOLOGY QUESTIONNAIRE - 12/19/17 1511      Lymphedema Assessments   Lymphedema Assessments  Lower extremities      Right Lower  Extremity Lymphedema   Other  RLE limb volume from toes to tibial tuberosity (A-D) measures 4641.59  ml. RLE knee to groin limb volume measures 6431.92 ml. RLE limb volume for full leg (A-G) measures 11073.51 ml    Other  Limb volume differential for A-D segment measures 12.2%, R leg > than left leg. LVD for A-G segment measures 3.98%, R thigh greater than left. LVD for full leg  (A-G) measures 7.43%, R>L.      Left Lower Extremity Lymphedema   Other  LLE limb volume from toes to tibial tuberosity (A-D) measures  4074.56 ml. LLE knee to groin limb volume measures 6175.81 ml. LLE limb volume for full leg (A-G) measures 10250.37 ml              OT Treatments/Exercises (OP) - 12/19/17 0001      ADLs   ADL Education Given  Yes      Manual Therapy   Manual Therapy  Edema management;Compression Bandaging    Manual therapy comments  Initial BLE comparative limb volumetrics    Compression Bandaging  multi-layer, knee length compression wrap from toes to tibial tuberosity with short stretch wraps over Ropsidal foam.              OT Education - 12/19/17 1518    Education Details  Commenced Pt edu for LE self care. Emphasis today on form and function of short stretch compression using specified gradient wrapping pattern to reduce limb volume,. Also discussed outcome of baseline limb volumetrics.    Person(s) Educated  Patient    Methods  Explanation;Demonstration;Handout    Comprehension  Verbalized understanding;Returned demonstration          OT Long Term Goals - 12/01/17 1423      OT LONG TERM GOAL #1   Title  Pt will be able to apply gradient /compression wraps using correct techniques withing 5 OT visits to increase independence w/ LE self care and to increase limb volume reduction.    Baseline  Max A    Time  5    Period  Weeks    Status  New    Target Date  --   5th OT visit     OT LONG TERM GOAL #2   Title  Pt to achieve 10 % limb volume reductions bilaterally to  restore more normal limb shape, to limit lymphedema progression, and to reduce infection risk.    Baseline  Max A    Time  12    Period  Weeks    Status  New    Target Date  03/01/18      OT LONG TERM GOAL #3   Title  Pt independent with simple self-MLD by DC date to ensure optimal LE control and self management to limit progression and further functional decline.    Baseline  Max A    Time  12    Period  Weeks    Status  New    Target Date  03/01/18      OT LONG TERM GOAL #4   Title  Pt 85% or greater compliant with all LE self care/ home program components by DC, including daily simple self-MLD, skin care, compression therapy and ther ex by DC date to ensure optimal lymphedema self management over time.    Baseline  Max A    Time  12    Target Date  03/01/18      OT LONG TERM GOAL #5   Title  Management Phase: Pt retain volumetric reduction over time by performing LE self care components daily as evidenced by no greater than 3% limb volume increases measured bilaterally below  the knee.    Baseline  Max A    Time  6    Period  Months    Status  New    Target Date  05/30/18            Plan - 12/19/17 1519    Clinical Impression Statement  Completed baseline limb volumetrics. Limb volume differential for A-D segment measures 12.2%, R leg > than left leg. LVD for A-G segment measures 3.98%, R thigh greater than left. LVD for full leg  (A-G) measures 7.43%, R>L.. Commenced  knee length, multi-layer, compression wrap    from toes to tibial tuberosity using short stretch wraps over Rosidal foam and stockinett. Took baseline photo on Pt's phone for before and after comparisons. Pt tolerated and participated in all aspects of clinical visit for lymphedema care.     Occupational performance deficits (Please refer to evaluation for details):  ADL's;IADL's;Work;Leisure;Social Participation;Other   body image   Rehab Potential  Excellent    Current Impairments/barriers affecting  progress:  long term nature of limb swelling with stubborn tissue fibrosis is typically didfficult to reduce  quickly    OT Frequency  Other (comment)   3-5 x weekly   OT Duration  12 weeks   and PRN   OT Treatment/Interventions  Self-care/ADL training;Therapeutic exercise;Manual lymph drainage;Coping strategies training;Compression bandaging;Patient/family education;Other (comment);Therapeutic activities;DME and/or AE instruction;Manual Therapy   skin care to 4reduce tissue fibrosis   Clinical Decision Making  Limited treatment options, no task modification necessary    OT Home Exercise Plan  lymphatic pumping therex- BLE    Recommended Other Services  fit with BLE custom, flat knit compression garments that will decrease tissue fibrosis and contain swelling.  Consider Tactile Medical Flexitouch system to assist w/ long-term LE self -management over time to limity progression. This 32-chamber device is the only sequential device which mimics proximal to distal lymphatic decongestion to return interstitial fluid through lymphatic structures to the heart.     Consulted and Agree with Plan of Care  Patient       Patient will benefit from skilled therapeutic intervention in order to improve the following deficits and impairments:  Decreased mobility, Decreased skin integrity, Decreased knowledge of precautions, Increased edema, Pain, Decreased activity tolerance, Decreased knowledge of use of DME  Visit Diagnosis: Lymphedema, hereditary    Problem List Patient Active Problem List   Diagnosis Date Noted  . Breast lump 11/08/2017  . Genital herpes simplex 11/08/2017    Loel Dubonnet, MS, OTR/L, [facility] 12/19/17 3:28 PM  [facility] MAIN [facility] SERVICES [address] [place], [place], [address] Phone: [phone]   Fax:  [phone]  Name: Meagan Cohen MRN: [id number] Date of Birth: 30-Aug-1965

## 2017-12-19 NOTE — Patient Instructions (Signed)

## 2017-12-20 ENCOUNTER — Ambulatory Visit: Payer: BC Managed Care – PPO | Admitting: Occupational Therapy

## 2017-12-20 DIAGNOSIS — Q820 Hereditary lymphedema: Secondary | ICD-10-CM

## 2017-12-20 NOTE — Therapy (Signed)
[facility] [address] [place], [place], [address] Phone: [phone]   Fax:  [phone]  Occupational Therapy Treatment  Patient Details  Name: Meagan Cohen MRN: [id number] Date of Birth: 12/15/1965 Referring Provider: Elinor ParkinsonMax T Hyatt, DPM   Encounter Date: 12/20/2017  OT End of Session - 12/20/17 1658    Visit Number  3    Number of Visits  36    Date for OT Re-Evaluation  02/28/18    Authorization Type  Baseline volumetrics 12/19/17    OT Start Time  0110    OT Stop Time  0215    OT Time Calculation (min)  65 min    Activity Tolerance  Patient tolerated treatment well    Behavior During Therapy  [facility] for tasks assessed/performed       No past medical history on file.  No past surgical history on file.  There were no vitals filed for this visit.  Subjective Assessment - 12/19/17 1357    Subjective   Meagan Cohen presents for OT visit 3/36 to address BLE primary lymphhedema. Pt presents with gradfient compression wraps in place.  "They made my toes tingle."    Pertinent History  BLE leg swelling onset at age 52 is in keeping with onset pattern for primary lymphedema praecox. No past medical hx on file    Limitations  BLE Leg swelling and  associated pain/ discomfort limits LB  dressing and fitting preferred street shoes. They negatively impact body image and participation in activities where legs are exposed. Leg swelling and pain limit participation in productive, leisure and social activities requiring standing, walking dependent sitting, including driving, for more than 15 mintes.     Patient Stated Goals  decrease leg swelling and kleep it from getting worse\    Pain Onset  Other (comment)   age 52                  OT Treatments/Exercises (OP) - 12/20/17 0001      ADLs   ADL Education Given  Yes      Manual Therapy   Manual Therapy  Edema management;Manual Lymphatic Drainage (MLD);Compression  Bandaging    Edema Management  skin care during MLD to improve skin hydration and mobility    Manual Lymphatic Drainage (MLD)  commenced MLD to RLE/ RLQ today utilizing short neck sequence, ipsilateral functional inguinal LN, deep abdominal      pathway, and LN in the leg.    Compression Bandaging  multi-layer, knee length compression wrap from toes to tibial tuberosity with short stretch wraps over Ropsidal foam.              OT Education - 12/20/17 1652    Education Details  Cont Pt edu for compression wrapping today for last half of visit.    Person(s) Educated  Patient    Methods  Explanation;Demonstration;Handout    Comprehension  Verbalized understanding;Returned demonstration          OT Long Term Goals - 12/01/17 1423      OT LONG TERM GOAL #1   Title  Pt will be able to apply gradient /compression wraps using correct techniques withing 5 OT visits to increase independence w/ LE self care and to increase limb volume reduction.    Baseline  Max A    Time  5    Period  Weeks    Status  New    Target Date  --  5th OT visit     OT LONG TERM GOAL #2   Title  Pt to achieve 10 % limb volume reductions bilaterally to restore more normal limb shape, to limit lymphedema progression, and to reduce infection risk.    Baseline  Max A    Time  12    Period  Weeks    Status  New    Target Date  03/01/18      OT LONG TERM GOAL #3   Title  Pt independent with simple self-MLD by DC date to ensure optimal LE control and self management to limit progression and further functional decline.    Baseline  Max A    Time  12    Period  Weeks    Status  New    Target Date  03/01/18      OT LONG TERM GOAL #4   Title  Pt 85% or greater compliant with all LE self care/ home program components by DC, including daily simple self-MLD, skin care, compression therapy and ther ex by DC date to ensure optimal lymphedema self management over time.    Baseline  Max A    Time  12    Target  Date  03/01/18      OT LONG TERM GOAL #5   Title  Management Phase: Pt retain volumetric reduction over time by performing LE self care components daily as evidenced by no greater than 3% limb volume increases measured bilaterally below  the knee.    Baseline  Max A    Time  6    Period  Months    Status  New    Target Date  05/30/18            Plan - 12/20/17 1659    Clinical Impression Statement  Pt tolerated compression wraps over last 24 hrs without difficulty. Tissue mildly decongested by palpable assessment . Pt denies 0pain / discomfort with wraps removed. Commenced MLD using short neck sequence, deep abdominal pathwway and functional inguinal LN. Provided Pt edu for compression wrapping, and by end of session she was able to re-apply wraps using correct technique with Max A. Cont as per [facility]. Pt is  off to a good start.    Occupational performance deficits (Please refer to evaluation for details):  ADL's;IADL's;Work;Leisure;Social Participation;Other   body image   Rehab Potential  Excellent    Current Impairments/barriers affecting progress:  long term nature of limb swelling with stubborn tissue fibrosis is typically didfficult to reduce       quickly    OT Frequency  Other (comment)   3-5 x weekly   OT Duration  12 weeks   and PRN   OT Treatment/Interventions  Self-care/ADL training;Therapeutic exercise;Manual lymph drainage;Coping strategies training;Compression bandaging;Patient/family education;Other (comment);Therapeutic activities;DME and/or AE instruction;Manual Therapy   skin care to 4reduce tissue fibrosis   Clinical Decision Making  Limited treatment options, no task modification necessary    OT Home Exercise Plan  lymphatic pumping therex- BLE    Recommended Other Services  fit with BLE custom, flat knit compression garments that will decrease tissue fibrosis and contain swelling.  Consider Tactile Medical Flexitouch system to assist w/ long-term LE self  -management over time to limity progression. This 32-chamber device is the only sequential device which mimics proximal to distal lymphatic decongestion to return interstitial fluid through lymphatic structures to the heart.     Consulted and Agree with Plan of Care  Patient  Patient will benefit from skilled therapeutic intervention in order to improve the following deficits and impairments:  Decreased mobility, Decreased skin integrity, Decreased knowledge of precautions, Increased edema, Pain, Decreased activity tolerance, Decreased knowledge of use of DME  Visit Diagnosis: Lymphedema, hereditary    Problem List Patient Active Problem List   Diagnosis Date Noted  . Breast lump 11/08/2017  . Genital herpes simplex 11/08/2017    Loel Dubonnet, MS, OTR/L, [facility] 12/20/17 5:04 PM  [facility] MAIN [facility] SERVICES [address] [place], [place], [address] Phone: [phone]   Fax:  [phone]  Name: KAITLAN BIN MRN: [id number] Date of Birth: 1965-06-05

## 2017-12-22 ENCOUNTER — Ambulatory Visit: Payer: BC Managed Care – PPO | Admitting: Occupational Therapy

## 2017-12-27 ENCOUNTER — Ambulatory Visit: Payer: BC Managed Care – PPO | Admitting: Occupational Therapy

## 2017-12-29 ENCOUNTER — Ambulatory Visit: Payer: BC Managed Care – PPO | Admitting: Occupational Therapy

## 2018-01-02 ENCOUNTER — Ambulatory Visit: Payer: BC Managed Care – PPO | Admitting: Occupational Therapy

## 2018-01-03 ENCOUNTER — Ambulatory Visit: Payer: BC Managed Care – PPO | Attending: Podiatry | Admitting: Occupational Therapy

## 2018-01-03 DIAGNOSIS — Q820 Hereditary lymphedema: Secondary | ICD-10-CM | POA: Diagnosis not present

## 2018-01-03 NOTE — Therapy (Signed)
[facility] MAIN [facility] SERVICES [address] [place], [place], [address] Phone: [phone]   Fax:  [phone]  Occupational Therapy Treatment  Patient Details  Name: Meagan Cohen MRN: [id number] Date of Birth: 11-20-1965 No data recorded  Encounter Date: 01/03/2018  OT End of Session - 01/03/18 1708    Visit Number  4    Number of Visits  36    Date for OT Re-Evaluation  02/28/18    Authorization Type  Baseline volumetrics 12/19/17    OT Start Time  0306    OT Stop Time  0416    OT Time Calculation (min)  70 min    Activity Tolerance  Patient tolerated treatment well    Behavior During Therapy  [facility] for tasks assessed/performed       No past medical history on file.  No past surgical history on file.  There were no vitals filed for this visit.  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 Pt brings wraps to clinic in a bag. Pt  states she has not been wearing compression wraps at work, but applies them  in the evening at home and sleeps in them over night. Pt reorts she had to cancell appointments last week and appointments next week due to work schedule.     Pertinent History  BLE leg swelling onset at age 71 is in keeping with onset pattern for primary lymphedema praecox. No past medical hx on file    Limitations  BLE Leg swelling and  associated pain/ discomfort limits LB  dressing and fitting preferred street shoes. They negatively impact body image and participation in activities where legs are exposed. Leg swelling and pain limit participation in productive, leisure and social activities requiring standing, walking dependent sitting, including driving, for more than 15 mintes.     Patient Stated Goals  decrease leg swelling and kleep it from getting worse\    Currently in Pain?  Yes   unchanged since initial eval    Pain Onset  Other (comment)   age 52                  OT Treatments/Exercises (OP) - 01/03/18 0001      ADLs   ADL Education Given  Yes      Manual Therapy   Manual Therapy  Edema management;Manual Lymphatic Drainage (MLD);Compression Bandaging    Edema Management  skin care during MLD to improve skin hydration and mobility    Manual Lymphatic Drainage (MLD)  commenced MLD to RLE/ RLQ today utilizing short neck sequence, ipsilateral functional inguinal LN, deep abdominal      pathway, and LN in the leg.    Compression Bandaging  multi-layer, knee length compression wrap from toes to tibial tuberosity with short stretch wraps over Ropsidal foam.              OT Education - 01/03/18 1642    Education Details  Pt edu for importance of consistent , daily compression wrapping between visits for optimal clinical outcome, Reviewed gradient compression wrapping techniques     during wrap application.     Person(s) Educated  Patient    Methods  Explanation;Demonstration;Handout    Comprehension  Verbalized understanding;Returned demonstration          OT Long Term Goals - 12/01/17 1423      OT  LONG TERM GOAL #1   Title  Pt will be able to apply gradient /compression wraps using correct techniques withing 5 OT visits to increase independence w/ LE self care and to increase limb volume reduction.    Baseline  Max A    Time  5    Period  Weeks    Status  New    Target Date  --   5th OT visit     OT LONG TERM GOAL #2   Title  Pt to achieve 10 % limb volume reductions bilaterally to restore more normal limb shape, to limit lymphedema progression, and to reduce infection risk.    Baseline  Max A    Time  12    Period  Weeks    Status  New    Target Date  03/01/18      OT LONG TERM GOAL #3   Title  Pt independent with simple self-MLD by DC date to ensure optimal LE control and self management to limit progression and further functional decline.    Baseline  Max A     Time  12    Period  Weeks    Status  New    Target Date  03/01/18      OT LONG TERM GOAL #4   Title  Pt 85% or greater compliant with all LE self care/ home program components by DC, including daily simple self-MLD, skin care, compression therapy and ther ex by DC date to ensure optimal lymphedema self management over time.    Baseline  Max A    Time  12    Target Date  03/01/18      OT LONG TERM GOAL #5   Title  Management Phase: Pt retain volumetric reduction over time by performing LE self care components daily as evidenced by no greater than 3% limb volume increases measured bilaterally below  the knee.    Baseline  Max A    Time  6    Period  Months    Status  New    Target Date  05/30/18            Plan - 01/03/18 1708    Clinical Impression Statement  RLE appears unchanged in volume reduction and tissue density since last seen on 9/24, missing visits last week. She dem9nstrates very limited compliance with compression protocol and tissue integrity is palpably unchanged. Provided Pt edu as to expected poor prognosis without daily compression therapy as directed. Provided MLD to RLE using functional RLE/RLQ pathways. Compression wraps applied as established. Pt cancelled next weeks visits due to work demands. Cont as per POC w/ guarded prognosis.    Occupational performance deficits (Please refer to evaluation for details):  ADL's;IADL's;Work;Leisure;Social Participation;Other   body image   Rehab Potential  Excellent    Current Impairments/barriers affecting progress:  long term nature of limb swelling with stubborn tissue fibrosis is typically didfficult to reduce       quickly    OT Frequency  Other (comment)   3-5 x weekly   OT Duration  12 weeks   and PRN   OT Treatment/Interventions  Self-care/ADL training;Therapeutic exercise;Manual lymph drainage;Coping strategies training;Compression bandaging;Patient/family education;Other (comment);Therapeutic activities;DME  and/or AE instruction;Manual Therapy   skin care to 4reduce tissue fibrosis   Clinical Decision Making  Limited treatment options, no task modification necessary    OT Home Exercise Plan  lymphatic pumping therex- BLE    Recommended Other Services  fit with  BLE custom, flat knit compression garments that will decrease tissue fibrosis and contain swelling.  Consider Tactile Medical Flexitouch system to assist w/ long-term LE self -management over time to limity progression. This 32-chamber device is the only sequential device which mimics proximal to distal lymphatic decongestion to return interstitial fluid through lymphatic structures to the heart.     Consulted and Agree with Plan of Care  Patient       Patient will benefit from skilled therapeutic intervention in order to improve the following deficits and impairments:  Decreased mobility, Decreased skin integrity, Decreased knowledge of precautions, Increased edema, Pain, Decreased activity tolerance, Decreased knowledge of use of DME  Visit Diagnosis: Lymphedema, hereditary    Problem List Patient Active Problem List   Diagnosis Date Noted  . Breast lump 11/08/2017  . Genital herpes simplex 11/08/2017   Loel Dubonnet, MS, OTR/L, [facility] 01/03/18 5:14 PM  [place] MAIN [facility] SERVICES [address] [place], [place], [address] Phone: [phone]   Fax:  [phone]  Name: Meagan Cohen MRN: [id number] Date of Birth: January 07, 1966

## 2018-01-05 ENCOUNTER — Ambulatory Visit: Payer: BC Managed Care – PPO | Admitting: Occupational Therapy

## 2018-01-05 DIAGNOSIS — Q820 Hereditary lymphedema: Secondary | ICD-10-CM | POA: Diagnosis not present

## 2018-01-05 NOTE — Therapy (Signed)
[facility] MAIN [facility] SERVICES [address] [place], [place], [address] Phone: [phone]   Fax:  [phone]  Occupational Therapy Treatment  Patient Details  Name: Meagan Cohen MRN: [id number] Date of Birth: Sep 02, 1965 No data recorded  Encounter Date: 01/05/2018  OT End of Session - 01/05/18 1644    Visit Number  5    Number of Visits  36    Date for OT Re-Evaluation  02/28/18    Authorization Type  Baseline volumetrics 12/19/17    OT Start Time  0310    OT Stop Time  0410    OT Time Calculation (min)  60 min    Activity Tolerance  Patient tolerated treatment well    Behavior During Therapy  [facility] for tasks assessed/performed       No past medical history on file.  No past surgical history on file.  There were no vitals filed for this visit.  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 Pt brings wraps to clinic in a bag. Pt states, "I wrapped my foot right this time." Pt states at end of visits that she has to cx next week or 2's visits 2/2 work demands.    Pertinent History  BLE leg swelling onset at age 52 is in keeping with onset pattern for primary lymphedema praecox. No past medical hx on file    Limitations  BLE Leg swelling and  associated pain/ discomfort limits LB  dressing and fitting preferred street shoes. They negatively impact body image and participation in activities where legs are exposed. Leg swelling and pain limit participation in productive, leisure and social activities requiring standing, walking dependent sitting, including driving, for more than 15 mintes.     Patient Stated Goals  decrease leg swelling and kleep it from getting worse\    Pain Onset  Other (comment)   age 52                  OT Treatments/Exercises (OP) - 01/05/18 0001      ADLs   ADL Education Given  Yes       Manual Therapy   Manual Therapy  Edema management;Manual Lymphatic Drainage (MLD);Compression Bandaging;Other (comment)   skin care to LLE throughout session   Manual Lymphatic Drainage (MLD)  MLD to LLE as established using fiunctional inguinal LN and deep abdominal lymphatics.    Compression Bandaging  multi-layer, knee length compression wrap from toes to tibial tuberosity with short stretch wraps over Ropsidal foam.              OT Education - 01/05/18 1643    Education Details  Discussed use of sequential pneumatic compression device (Flexitouch) for long term self management tool. Pt agrees to exploring insurance benefiots for deviice. OT will send demographics to Manufacturer's rep next week. Pt edu for simple self MLD. By end of session Pt able to perform "short neck sequence" with mod assist, including modeling. Pt instructed on proper wear and care of custom dorsal foot chip bag     Person(s) Educated  Patient    Methods  Explanation;Demonstration;Handout    Comprehension  Verbalized understanding;Returned demonstration          OT Long Term Goals - 12/01/17 1423      OT LONG TERM GOAL #1   Title  Pt will be able to apply  gradient /compression wraps using correct techniques withing 5 OT visits to increase independence w/ LE self care and to increase limb volume reduction.    Baseline  Max A    Time  5    Period  Weeks    Status  New    Target Date  --   5th OT visit     OT LONG TERM GOAL #2   Title  Pt to achieve 10 % limb volume reductions bilaterally to restore more normal limb shape, to limit lymphedema progression, and to reduce infection risk.    Baseline  Max A    Time  12    Period  Weeks    Status  New    Target Date  03/01/18      OT LONG TERM GOAL #3   Title  Pt independent with simple self-MLD by DC date to ensure optimal LE control and self management to limit progression and further functional decline.    Baseline  Max A    Time  12    Period   Weeks    Status  New    Target Date  03/01/18      OT LONG TERM GOAL #4   Title  Pt 85% or greater compliant with all LE self care/ home program components by DC, including daily simple self-MLD, skin care, compression therapy and ther ex by DC date to ensure optimal lymphedema self management over time.    Baseline  Max A    Time  12    Target Date  03/01/18      OT LONG TERM GOAL #5   Title  Management Phase: Pt retain volumetric reduction over time by performing LE self care components daily as evidenced by no greater than 3% limb volume increases measured bilaterally below  the knee.    Baseline  Max A    Time  6    Period  Months    Status  New    Target Date  05/30/18            Plan - 01/05/18 1645    Clinical Impression Statement  Discussed use of sequential pneumatic compression device (Flexitouch) for long term self management tool. Pt agrees to exploring insurance benefiots for deviice. OT will send demographics to Manufacturer's rep next week.  Dorsal L foot is slightly less dense with lymphatic congestin at base of toes today. Pt reports she has been working on improving wrapping compliance and technique at home between visits. Fabricated custom chip pad and placed it at base of toes on top of stockinett and under Rosidal foam in effort to furth soften spongey, fatty fibroosis on the foot. Pt tolerated MLD without difficulty. Prognosis  downgraded from good to fair with longer interval between clinical visits and Pt not utilizing compression wraps during daytime when she is gravity dependent.     Occupational performance deficits (Please refer to evaluation for details):  ADL's;IADL's;Work;Leisure;Social Participation;Other   body image   Rehab Potential  Excellent    Current Impairments/barriers affecting progress:  long term nature of limb swelling with stubborn tissue fibrosis is typically didfficult to reduce       quickly    OT Frequency  Other (comment)   3-5 x  weekly   OT Duration  12 weeks   and PRN   OT Treatment/Interventions  Self-care/ADL training;Therapeutic exercise;Manual lymph drainage;Coping strategies training;Compression bandaging;Patient/family education;Other (comment);Therapeutic activities;DME and/or AE instruction;Manual Therapy   skin care to 4reduce  tissue fibrosis   Clinical Decision Making  Limited treatment options, no task modification necessary    OT Home Exercise Plan  lymphatic pumping therex- BLE    Recommended Other Services  fit with BLE custom, flat knit compression garments that will decrease tissue fibrosis and contain swelling.  Consider Tactile Medical Flexitouch system to assist w/ long-term LE self -management over time to limity progression. This 32-chamber device is the only sequential device which mimics proximal to distal lymphatic decongestion to return interstitial fluid through lymphatic structures to the heart.     Consulted and Agree with Plan of Care  Patient       Patient will benefit from skilled therapeutic intervention in order to improve the following deficits and impairments:  Decreased mobility, Decreased skin integrity, Decreased knowledge of precautions, Increased edema, Pain, Decreased activity tolerance, Decreased knowledge of use of DME  Visit Diagnosis: Lymphedema, hereditary    Problem List Patient Active Problem List   Diagnosis Date Noted  . Breast lump 11/08/2017  . Genital herpes simplex 11/08/2017    Loel Dubonnet, MS, OTR/L, [facility] 01/05/18 4:52 PM   [facility] MAIN [facility] SERVICES [address] [place], [place], [address] Phone: [phone]   Fax:  [phone]  Name: Meagan Cohen MRN: [id number] Date of Birth: [birth year]

## 2018-01-09 ENCOUNTER — Ambulatory Visit: Payer: BC Managed Care – PPO | Admitting: Occupational Therapy

## 2018-01-10 ENCOUNTER — Ambulatory Visit: Payer: BC Managed Care – PPO | Admitting: Occupational Therapy

## 2018-01-12 ENCOUNTER — Ambulatory Visit: Payer: BC Managed Care – PPO | Admitting: Occupational Therapy

## 2018-01-16 ENCOUNTER — Ambulatory Visit: Payer: BC Managed Care – PPO | Admitting: Occupational Therapy

## 2018-01-17 ENCOUNTER — Ambulatory Visit: Payer: BC Managed Care – PPO | Admitting: Occupational Therapy

## 2018-01-19 ENCOUNTER — Ambulatory Visit: Payer: BC Managed Care – PPO | Admitting: Occupational Therapy

## 2018-01-23 ENCOUNTER — Ambulatory Visit: Payer: BC Managed Care – PPO | Admitting: Occupational Therapy

## 2018-01-24 ENCOUNTER — Ambulatory Visit: Payer: BC Managed Care – PPO | Admitting: Occupational Therapy

## 2018-01-25 ENCOUNTER — Ambulatory Visit: Payer: BC Managed Care – PPO | Admitting: Occupational Therapy

## 2018-01-26 ENCOUNTER — Ambulatory Visit: Payer: BC Managed Care – PPO | Admitting: Occupational Therapy

## 2018-01-30 ENCOUNTER — Ambulatory Visit: Payer: BC Managed Care – PPO | Attending: Podiatry | Admitting: Occupational Therapy

## 2018-01-31 ENCOUNTER — Ambulatory Visit: Payer: BC Managed Care – PPO | Admitting: Occupational Therapy

## 2018-02-02 ENCOUNTER — Ambulatory Visit: Payer: BC Managed Care – PPO | Admitting: Occupational Therapy

## 2018-02-03 ENCOUNTER — Ambulatory Visit: Payer: Self-pay | Admitting: *Deleted

## 2018-02-03 NOTE — Telephone Encounter (Signed)
Patient has been exposed to the flu and she wants to know what to now what she can do to avoid the flu. 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 Patient is concerned because she did get the flu last year although she got vaccine and she has been vaccinated this year- last month. Patient wants to know if she need preventative medication.  Call to office- patient is not current- she will be seen elsewhere- she did schedule a NP appointment. Reason for Disposition . 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 TYPE of EXPOSURE: "How were you exposed?" (e.g., close contact, not a close contact)     Close contact- work related exposure 2. DATE of EXPOSURE: "When did the exposure occur?" (e.g., hour, days, weeks)     02/02/18 3. PREGNANCY: "Is there any chance you are pregnant?" "When was your last menstrual period?"     No- inconsistent LMP  4. HIGH RISK for COMPLICATIONS: "Do you have any heart or lung problems? Do you have a weakened immune system?" (e.g., CHF, COPD, asthma, HIV positive, chemotherapy, renal failure, diabetes mellitus, sickle cell anemia)     No 5. SYMPTOMS: "Do you have any symptoms?" (e.g., cough, fever, sore throat, difficulty breathing).     no  Protocols used: INFLUENZA EXPOSURE-A-AH

## 2018-02-07 ENCOUNTER — Encounter: Payer: BC Managed Care – PPO | Admitting: Occupational Therapy

## 2018-02-09 ENCOUNTER — Ambulatory Visit: Payer: BC Managed Care – PPO | Admitting: Occupational Therapy

## 2018-02-13 ENCOUNTER — Ambulatory Visit: Payer: BC Managed Care – PPO | Admitting: Occupational Therapy

## 2018-02-13 ENCOUNTER — Encounter: Payer: Self-pay | Admitting: Emergency Medicine

## 2018-02-14 ENCOUNTER — Ambulatory Visit: Payer: BC Managed Care – PPO | Admitting: Occupational Therapy

## 2018-02-16 ENCOUNTER — Ambulatory Visit: Payer: BC Managed Care – PPO | Admitting: Occupational Therapy

## 2018-02-17 ENCOUNTER — Encounter: Payer: Self-pay | Admitting: Emergency Medicine

## 2018-02-17 ENCOUNTER — Ambulatory Visit: Payer: Self-pay | Admitting: Family Medicine

## 2018-02-20 ENCOUNTER — Ambulatory Visit: Payer: BC Managed Care – PPO | Admitting: Occupational Therapy

## 2018-02-20 ENCOUNTER — Telehealth: Payer: Self-pay | Admitting: Family Medicine

## 2018-02-20 NOTE — Telephone Encounter (Signed)
Called and spoke with pt regarding her appt with Dr. Creta LevinStallings on 03/01/18. Due to Dr. Creta LevinStallings schedule change, pt will need to be rescheduled. Pt stated that she didn't have her calender and will call tomorrow and reschedule.

## 2018-02-21 ENCOUNTER — Ambulatory Visit: Payer: BC Managed Care – PPO | Admitting: Occupational Therapy

## 2018-02-27 ENCOUNTER — Ambulatory Visit: Payer: BC Managed Care – PPO | Admitting: Occupational Therapy

## 2018-02-28 ENCOUNTER — Ambulatory Visit: Payer: BC Managed Care – PPO | Admitting: Occupational Therapy

## 2018-03-01 ENCOUNTER — Ambulatory Visit: Payer: Self-pay | Admitting: Family Medicine

## 2018-03-02 ENCOUNTER — Ambulatory Visit: Payer: BC Managed Care – PPO | Admitting: Occupational Therapy

## 2018-03-06 ENCOUNTER — Ambulatory Visit: Payer: BC Managed Care – PPO | Admitting: Occupational Therapy

## 2018-03-07 ENCOUNTER — Ambulatory Visit: Payer: BC Managed Care – PPO | Admitting: Occupational Therapy

## 2018-03-09 ENCOUNTER — Ambulatory Visit: Payer: BC Managed Care – PPO | Admitting: Occupational Therapy

## 2018-03-13 ENCOUNTER — Ambulatory Visit: Payer: BC Managed Care – PPO | Admitting: Occupational Therapy

## 2018-03-14 ENCOUNTER — Ambulatory Visit: Payer: BC Managed Care – PPO | Admitting: Occupational Therapy

## 2018-03-16 ENCOUNTER — Ambulatory Visit: Payer: BC Managed Care – PPO | Admitting: Occupational Therapy

## 2022-07-07 ENCOUNTER — Other Ambulatory Visit: Payer: Self-pay | Admitting: Obstetrics and Gynecology

## 2022-07-07 DIAGNOSIS — N6019 Diffuse cystic mastopathy of unspecified breast: Secondary | ICD-10-CM

## 2022-07-19 ENCOUNTER — Ambulatory Visit
Admission: RE | Admit: 2022-07-19 | Discharge: 2022-07-19 | Disposition: A | Discharge status: Home / Self Care | Payer: BC Managed Care – PPO | Source: Ambulatory Visit | Attending: Obstetrics and Gynecology | Admitting: Obstetrics and Gynecology

## 2022-07-19 DIAGNOSIS — N6019 Diffuse cystic mastopathy of unspecified breast: Secondary | ICD-10-CM

## 2024-01-10 ENCOUNTER — Emergency Department (HOSPITAL_BASED_OUTPATIENT_CLINIC_OR_DEPARTMENT_OTHER)
Admission: EM | Admit: 2024-01-10 | Discharge: 2024-01-10 | Disposition: A | Discharge status: Home / Self Care | Attending: Emergency Medicine | Admitting: Emergency Medicine

## 2024-01-10 ENCOUNTER — Emergency Department (HOSPITAL_BASED_OUTPATIENT_CLINIC_OR_DEPARTMENT_OTHER)

## 2024-01-10 ENCOUNTER — Encounter (HOSPITAL_BASED_OUTPATIENT_CLINIC_OR_DEPARTMENT_OTHER): Payer: Self-pay | Admitting: *Deleted

## 2024-01-10 ENCOUNTER — Other Ambulatory Visit: Payer: Self-pay

## 2024-01-10 DIAGNOSIS — Y92410 Unspecified street and highway as the place of occurrence of the external cause: Secondary | ICD-10-CM | POA: Diagnosis not present

## 2024-01-10 DIAGNOSIS — R0789 Other chest pain: Secondary | ICD-10-CM | POA: Insufficient documentation

## 2024-01-10 DIAGNOSIS — M542 Cervicalgia: Secondary | ICD-10-CM | POA: Diagnosis not present

## 2024-01-10 DIAGNOSIS — M79642 Pain in left hand: Secondary | ICD-10-CM | POA: Insufficient documentation

## 2024-01-10 DIAGNOSIS — R519 Headache, unspecified: Secondary | ICD-10-CM | POA: Insufficient documentation

## 2024-01-10 DIAGNOSIS — M546 Pain in thoracic spine: Secondary | ICD-10-CM | POA: Insufficient documentation

## 2024-01-10 LAB — COMPREHENSIVE METABOLIC PANEL WITH GFR
ALT: 23 U/L (ref 0–44)
AST: 19 U/L (ref 15–41)
Albumin: 4.8 g/dL (ref 3.5–5.0)
Alkaline Phosphatase: 97 U/L (ref 38–126)
Anion gap: 12 (ref 5–15)
BUN: 12 mg/dL (ref 6–20)
CO2: 25 mmol/L (ref 22–32)
Calcium: 10.5 mg/dL — ABNORMAL HIGH (ref 8.9–10.3)
Chloride: 103 mmol/L (ref 98–111)
Creatinine, Ser: 0.76 mg/dL (ref 0.44–1.00)
GFR, Estimated: >60 mL/min (ref >60)
Glucose, Bld: 104 mg/dL — ABNORMAL HIGH (ref 70–99)
Potassium: 4 mmol/L (ref 3.5–5.1)
Sodium: 140 mmol/L (ref 135–145)
Total Bilirubin: 0.3 mg/dL (ref 0.0–1.2)
Total Protein: 7.9 g/dL (ref 6.5–8.1)

## 2024-01-10 LAB — CBC WITH DIFFERENTIAL/PLATELET
Abs Immature Granulocytes: 0.02 K/uL (ref 0.00–0.07)
Basophils Absolute: 0 K/uL (ref 0.0–0.1)
Basophils Relative: 0 %
Eosinophils Absolute: 0.1 K/uL (ref 0.0–0.5)
Eosinophils Relative: 1 %
HCT: 41.9 % (ref 36.0–46.0)
Hemoglobin: 14.2 g/dL (ref 12.0–15.0)
Immature Granulocytes: 0 %
Lymphocytes Relative: 20 %
Lymphs Abs: 1.2 K/uL (ref 0.7–4.0)
MCH: 30.2 pg (ref 26.0–34.0)
MCHC: 33.9 g/dL (ref 30.0–36.0)
MCV: 89.1 fL (ref 80.0–100.0)
Monocytes Absolute: 0.3 K/uL (ref 0.1–1.0)
Monocytes Relative: 5 %
Neutro Abs: 4.5 K/uL (ref 1.7–7.7)
Neutrophils Relative %: 74 %
Platelets: 219 K/uL (ref 150–400)
RBC: 4.7 MIL/uL (ref 3.87–5.11)
RDW: 13.1 % (ref 11.5–15.5)
WBC: 6.2 K/uL (ref 4.0–10.5)
nRBC: 0 % (ref 0.0–0.2)

## 2024-01-10 LAB — TROPONIN T, HIGH SENSITIVITY: Troponin T High Sensitivity: <15 ng/L (ref 0–19)

## 2024-01-10 MED ORDER — LIDOCAINE 5 % EX PTCH: 1.0000 | MEDICATED_PATCH | CUTANEOUS | 0 refills | Status: AC

## 2024-01-10 MED ORDER — CYCLOBENZAPRINE HCL 5 MG PO TABS: 5.0000 mg | ORAL_TABLET | Freq: Three times a day (TID) | ORAL | 0 refills | Status: AC | PRN
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
## 2024-01-10 NOTE — ED Notes (Signed)
 C-Collar removed after EDP VO.

## 2024-01-12 ENCOUNTER — Emergency Department (HOSPITAL_BASED_OUTPATIENT_CLINIC_OR_DEPARTMENT_OTHER)

## 2024-01-12 ENCOUNTER — Emergency Department (HOSPITAL_BASED_OUTPATIENT_CLINIC_OR_DEPARTMENT_OTHER)
Admission: EM | Admit: 2024-01-12 | Discharge: 2024-01-12 | Disposition: A | Discharge status: Home / Self Care | Attending: Emergency Medicine | Admitting: Emergency Medicine

## 2024-01-12 ENCOUNTER — Other Ambulatory Visit: Payer: Self-pay

## 2024-01-12 DIAGNOSIS — M25572 Pain in left ankle and joints of left foot: Secondary | ICD-10-CM | POA: Insufficient documentation
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
## 2024-01-12 NOTE — Discharge Instructions (Addendum)
 The x-ray of your left ankle did not show any acute findings.  You may continue taking the medication that was prescribed to you 2 days ago.  If you experience any worsening symptoms please return to the emergency department.
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
